# Patient Record
Sex: Female | Born: 1960 | Race: White | Hispanic: No | Smoking: Current every day smoker
Health system: Southern US, Community
[De-identification: ages and names within clinical notes are randomized; demographics above are authoritative.]

## PROBLEM LIST (undated history)

## (undated) DIAGNOSIS — Z720 Tobacco use: Secondary | ICD-10-CM

## (undated) DIAGNOSIS — J45909 Unspecified asthma, uncomplicated: Secondary | ICD-10-CM

## (undated) DIAGNOSIS — K219 Gastro-esophageal reflux disease without esophagitis: Secondary | ICD-10-CM

## (undated) DIAGNOSIS — I5189 Other ill-defined heart diseases: Secondary | ICD-10-CM

## (undated) DIAGNOSIS — I3139 Other pericardial effusion (noninflammatory): Secondary | ICD-10-CM

## (undated) DIAGNOSIS — D472 Monoclonal gammopathy: Secondary | ICD-10-CM

## (undated) DIAGNOSIS — G621 Alcoholic polyneuropathy: Secondary | ICD-10-CM

## (undated) DIAGNOSIS — Z7289 Other problems related to lifestyle: Secondary | ICD-10-CM

## (undated) DIAGNOSIS — F1090 Alcohol use, unspecified, uncomplicated: Secondary | ICD-10-CM

## (undated) DIAGNOSIS — Z789 Other specified health status: Secondary | ICD-10-CM

## (undated) HISTORY — DX: Other ill-defined heart diseases: I51.89

## (undated) HISTORY — DX: Other pericardial effusion (noninflammatory): I31.39

## (undated) HISTORY — PX: NO PAST SURGERIES: SHX2092

---

## 2012-09-07 ENCOUNTER — Emergency Department: Payer: Self-pay | Admitting: Emergency Medicine

## 2012-09-07 LAB — BASIC METABOLIC PANEL
BUN: 10 mg/dL (ref 7–18)
Calcium, Total: 8.9 mg/dL (ref 8.5–10.1)
Co2: 24 mmol/L (ref 21–32)
EGFR (Non-African Amer.): >60
Glucose: 104 mg/dL — ABNORMAL HIGH (ref 65–99)
Osmolality: 273 (ref 275–301)

## 2012-09-07 LAB — CBC
MCV: 91 fL (ref 80–100)
Platelet: 201 10*3/uL (ref 150–440)
RBC: 4.71 10*6/uL (ref 3.80–5.20)
WBC: 8 10*3/uL (ref 3.6–11.0)

## 2012-09-09 ENCOUNTER — Telehealth: Payer: Self-pay

## 2012-09-09 NOTE — Telephone Encounter (Signed)
Pt needs to schedule GXT per Dr Kirke Corin 4-5 days and follow up after. Unable to leave msg.

## 2012-09-10 NOTE — Telephone Encounter (Signed)
NA x2

## 2017-02-20 ENCOUNTER — Other Ambulatory Visit: Payer: Self-pay

## 2017-02-20 ENCOUNTER — Emergency Department
Admission: EM | Admit: 2017-02-20 | Discharge: 2017-02-20 | Disposition: A | Discharge status: Home / Self Care | Payer: 59 | Attending: Emergency Medicine | Admitting: Emergency Medicine

## 2017-02-20 ENCOUNTER — Emergency Department: Payer: 59

## 2017-02-20 DIAGNOSIS — L03116 Cellulitis of left lower limb: Secondary | ICD-10-CM

## 2017-02-20 DIAGNOSIS — M79662 Pain in left lower leg: Secondary | ICD-10-CM | POA: Diagnosis present

## 2017-02-20 LAB — COMPREHENSIVE METABOLIC PANEL
ALK PHOS: 200 U/L — AB (ref 38–126)
ALT: 41 U/L (ref 14–54)
ANION GAP: 11 (ref 5–15)
AST: 45 U/L — ABNORMAL HIGH (ref 15–41)
Albumin: 4.3 g/dL (ref 3.5–5.0)
BUN: 14 mg/dL (ref 6–20)
CALCIUM: 9 mg/dL (ref 8.9–10.3)
CHLORIDE: 104 mmol/L (ref 101–111)
CO2: 23 mmol/L (ref 22–32)
Creatinine, Ser: 0.6 mg/dL (ref 0.44–1.00)
GFR calc non Af Amer: >60 mL/min (ref >60)
Glucose, Bld: 174 mg/dL — ABNORMAL HIGH (ref 65–99)
POTASSIUM: 3.9 mmol/L (ref 3.5–5.1)
SODIUM: 138 mmol/L (ref 135–145)
Total Bilirubin: 0.5 mg/dL (ref 0.3–1.2)
Total Protein: 8 g/dL (ref 6.5–8.1)

## 2017-02-20 LAB — CBC
HCT: 40.5 % (ref 35.0–47.0)
Hemoglobin: 13.7 g/dL (ref 12.0–16.0)
MCH: 30.1 pg (ref 26.0–34.0)
MCHC: 33.7 g/dL (ref 32.0–36.0)
MCV: 89.3 fL (ref 80.0–100.0)
Platelets: 204 10*3/uL (ref 150–440)
RBC: 4.54 MIL/uL (ref 3.80–5.20)
RDW: 13.1 % (ref 11.5–14.5)
WBC: 5.7 10*3/uL (ref 3.6–11.0)

## 2017-02-20 MED ORDER — CEPHALEXIN 500 MG PO CAPS
500.0000 mg | ORAL_CAPSULE | Freq: Once | ORAL | Status: AC
  Administered 2017-02-20: 500 mg via ORAL

## 2017-02-20 MED ORDER — CEPHALEXIN 500 MG PO CAPS: 500.0000 mg | ORAL_CAPSULE | Freq: Three times a day (TID) | ORAL | 0 refills | Status: DC

## 2017-02-20 MED ORDER — CEPHALEXIN 500 MG PO CAPS
ORAL_CAPSULE | ORAL | Status: AC
  Administered 2017-02-20: 500 mg via ORAL
  Filled 2017-02-20: qty 1

## 2017-02-20 NOTE — ED Provider Notes (Signed)
[facility] Emergency Department Provider Note  Time seen: 10:15 PM  I have reviewed the triage vital signs and the nursing notes.   HISTORY  Chief Complaint Leg Pain    HPI Peggy Pacheco is a 57 y.o. female with no significant past medical history presents to the emergency department for left lower extremity swelling and redness.  According to the patient for the past 3-4 weeks she has been experiencing redness and swelling with mild tenderness to the left lower extremity.  States the redness comes and goes the swelling comes and goes but has been worse over the past several days.  She cannot get in with her primary care doctor into the end of March so she came to the emergency department for evaluation.  Denies any chest pain or trouble breathing.  Denies any fever nausea or vomiting.  Largely negative review of systems otherwise.  No past medical history on file.  There are no active problems to display for this patient.   Prior to Admission medications   Not on File    No Known Allergies  No family history on file.  Social History Social History   Tobacco Use  . Smoking status: Not on file  Substance Use Topics  . Alcohol use: Not on file  . Drug use: Not on file    Review of Systems Constitutional: Negative for fever. Eyes: Negative for visual complaints ENT: Negative for recent illness/congestion Cardiovascular: Negative for chest pain. Respiratory: Negative for shortness of breath. Gastrointestinal: Negative for abdominal pain, vomiting  Genitourinary: Negative for urinary compaints Musculoskeletal: Left leg swelling, redness and tenderness times 1 month. Skin: Redness of the left lower extremity. Neurological: Negative for headache All other ROS negative  ____________________________________________   PHYSICAL EXAM:  VITAL SIGNS: ED Triage Vitals [02/20/17 2027]  Enc Vitals Group     BP (!) 157/101     Pulse Rate (!) 109     Resp 20     Temp 98.7 F (37.1 C)     Temp Source Oral     SpO2 97 %     Weight 148 lb (67.1 kg)     Height 5\' 3"  (1.6 m)     Head Circumference      Peak Flow      Pain Score 4     Pain Loc      Pain Edu?      Excl. in [place]?    Constitutional: Alert and oriented. Well appearing and in no distress. Eyes: Normal exam ENT   Head: Normocephalic and atraumatic.   Mouth/Throat: Mucous membranes are moist. Cardiovascular: Normal rate, regular rhythm. No murmur Respiratory: Normal respiratory effort without tachypnea nor retractions. Breath sounds are clear Gastrointestinal: Soft and nontender. No distention.  Musculoskeletal: Left leg tenderness, erythema with 1+ edema.  Normal-appearing right extremity.  2+ DP pulses to bilateral lower extremities.  Neurovascular intact. Neurologic:  Normal speech and language. No gross focal neurologic deficits Skin:  Skin is warm, dry.  Erythema to the left lower leg from mid tibia and distal into the foot. Psychiatric: Mood and affect are normal. Speech and behavior are normal.   ____________________________________________   RADIOLOGY  Ultrasound negative for DVT  ____________________________________________   INITIAL IMPRESSION / ASSESSMENT AND PLAN / ED COURSE  Pertinent labs & imaging results that were available during my care of the patient were reviewed by me and considered in my medical decision making (see chart for details).  Patient presents to  the emergency department for left lower extremity redness tenderness and swelling ongoing for the past 1 month.  Differential would include cellulitis versus DVT.  Patient's labs are largely within normal limits.  Ultrasound negative for DVT.  Neurovascular intact with 2+ DP pulse.  Highly suspect cellulitis.  We will dose Keflex in the emergency department and discharged with the same 3 times daily for the next 10 days.  I also will discharge with compression stocking prescription and  the patient is to follow-up with her doctor.  Patient agreeable to this plan of care.  ____________________________________________   FINAL CLINICAL IMPRESSION(S) / ED DIAGNOSES  Cellulitis    Harvest Dark, MD 02/20/17 2220

## 2017-02-20 NOTE — ED Notes (Signed)
Pt able to ambulate back to treatment room without difficulty. Pt has noted swelling and redness to left ankle and calf. Leg is warm to the touch. No breaks in skin noted and not infection or irritation around the toe nails. Left sided pedal pulse is intact equal to right pedal pulse. Pt in NAD.

## 2017-02-20 NOTE — ED Triage Notes (Signed)
Pt in with co left lower leg pain with swelling and redness x 2 months. Was sent here by occupational nurse to rule out DVT. Redness noted to lower leg, no hx of dvt or cellulitis.

## 2019-07-30 ENCOUNTER — Encounter: Payer: Self-pay | Admitting: Emergency Medicine

## 2019-07-30 ENCOUNTER — Other Ambulatory Visit: Payer: Self-pay

## 2019-07-30 ENCOUNTER — Ambulatory Visit
Admission: EM | Admit: 2019-07-30 | Discharge: 2019-07-30 | Disposition: A | Discharge status: Home / Self Care | Payer: BC Managed Care – PPO | Attending: Emergency Medicine | Admitting: Emergency Medicine

## 2019-07-30 DIAGNOSIS — Z20822 Contact with and (suspected) exposure to covid-19: Secondary | ICD-10-CM | POA: Diagnosis present

## 2019-07-30 DIAGNOSIS — M542 Cervicalgia: Secondary | ICD-10-CM

## 2019-07-30 MED ORDER — NAPROXEN 500 MG PO TABS: 500.0000 mg | ORAL_TABLET | Freq: Two times a day (BID) | ORAL | 0 refills | Status: DC

## 2019-07-30 MED ORDER — TIZANIDINE HCL 4 MG PO TABS: 4.0000 mg | ORAL_TABLET | Freq: Three times a day (TID) | ORAL | 0 refills | Status: DC | PRN

## 2019-07-30 NOTE — ED Provider Notes (Signed)
HPI  SUBJECTIVE:  Peggy Pacheco is a 59 y.o. female who presents with 2 issues.  First, she is requesting Covid testing.  She states that she has had a close exposure to Covid positive patient 3 days ago.  She reports a right-sided posterior headache at the insertion of the trapezius.  She denies fevers, body aches, nasal congestion, sore throat, loss of sense of smell or taste, cough, shortness of breath, nausea, vomiting, diarrhea, abdominal pain.  She got the J&J vaccine.  Second, she reports 1 week of a daily right posterior neck pain described as a "crick".  She is not sure if she slept on it wrong.  She denies trauma to her neck.  It is intermittent, stabbing, lasting seconds, seems to be brought on by swallowing.  No fevers, neck stiffness, numbness or tingling in arm, swelling in the area of pain, difficulty breathing, drooling, voice changes, rash, sensation of her throat swelling shut.  No paresthesias in the area of pain.  She tried Naprosyn with improvement in her symptoms.  Her neck pain is worse with swallowing, flexion extension, lateral bending to the left.  Has had similar pain before in this area, but is always resolved on its own.  Past medical history negative for diabetes, hypertension, coronary disease, chronic kidney disease, HIV, cancer, immunocompromise, pulmonary disease.  She is a smoker.  She does not recall getting chickenpox.  She has not had shingles.  CZY:SAYTKZ, [facility]   History reviewed. No pertinent past medical history.  Past Surgical History:  Procedure Laterality Date  . NO PAST SURGERIES      History reviewed. No pertinent family history.  Social History   Tobacco Use  . Smoking status: Current Every Day Smoker    Packs/day: 0.50  . Smokeless tobacco: Never Used  Substance Use Topics  . Alcohol use: Yes  . Drug use: Not Currently    No current facility-administered medications for this encounter.  Current Outpatient Medications:  .   omeprazole (PRILOSEC) 20 MG capsule, Take 20 mg by mouth daily., Disp: , Rfl:  .  naproxen (NAPROSYN) 500 MG tablet, Take 1 tablet (500 mg total) by mouth 2 (two) times daily., Disp: 20 tablet, Rfl: 0 .  tiZANidine (ZANAFLEX) 4 MG tablet, Take 1 tablet (4 mg total) by mouth every 8 (eight) hours as needed for muscle spasms., Disp: 30 tablet, Rfl: 0  No Known Allergies   ROS  As noted in HPI.   Physical Exam  BP (!) 163/93 (BP Location: Right Arm)   Pulse 82   Temp 98 F (36.7 C) (Oral)   Resp 18   Ht 5\' 3"  (1.6 m)   Wt 67.1 kg   SpO2 97%   BMI 26.20 kg/m   Constitutional: Well developed, well nourished, no acute distress Eyes:  EOMI, conjunctiva normal bilaterally HENT: Normocephalic, atraumatic,mucus membranes moist.  Right external ear, external ear canal TM normal.  No pain with palpation of mastoid.  No swelling behind the ear.  Tender right upper molar with decay.  Patient states this is not new.  No gingival swelling, expressible purulent drainage.  No trismus.  Normal oropharynx, uvula midline.  Normal rise of palate. Neck: Positive tenderness at the insertion of the right trapezius muscle.  Positive tenderness along the right trapezius muscle.  No tenderness along the sternocleidomastoid.  Pain aggravated with flexion extension and lateral bending to the left.  No meningismus.  No cervical lymphadenopathy.  Respiratory: Normal inspiratory effort Cardiovascular:  Normal rate GI: nondistended skin: No rash in area of pain, skin intact Musculoskeletal: Strength upper extremity equal bilaterally, sensation grossly intact over the entire right upper extremity.  Grip strength 5/5 and equal bilaterally. Neurologic: Alert & oriented x 3, no focal neuro deficits Psychiatric: Speech and behavior appropriate   ED Course   Medications - No data to display  Orders Placed This Encounter  Procedures  . SARS CORONAVIRUS 2 (TAT 6-24 HRS) Nasopharyngeal Nasopharyngeal Swab     Standing Status:   Standing    Number of Occurrences:   1    Order Specific Question:   Is this test for diagnosis or screening    Answer:   Screening    Order Specific Question:   Symptomatic for COVID-19 as defined by CDC    Answer:   No    Order Specific Question:   Hospitalized for COVID-19    Answer:   No    Order Specific Question:   Admitted to ICU for COVID-19    Answer:   No    Order Specific Question:   Previously tested for COVID-19    Answer:   No    Order Specific Question:   Resident in a congregate (group) care setting    Answer:   No    Order Specific Question:   Employed in healthcare setting    Answer:   No    Order Specific Question:   Pregnant    Answer:   No    Order Specific Question:   Has patient completed COVID vaccination(s) (2 doses of Pfizer/Moderna 1 dose of [employer])    Answer:   Yes    No results found for this or any previous visit (from the past 24 hour(s)). No results found.  ED Clinical Impression  1. Neck pain on right side   2. Close exposure to COVID-19 virus   3. Encounter for laboratory testing for COVID-19 virus      ED Assessment/Plan  1.  Covid testing.  Covid PCR sent.  2.  Right neck pain.  Suspect musculoskeletal cause as it is along the insertion of the trapezius and the trapezius itself.  It is aggravated with activation of the trapezius.  There is no rash suggestive of shingles, no induration, fluctuance, erythema suggestive of an abscess.  I cannot explain why this area hurts when she swallows.  I am unsure if this is related to the decayed tooth that she has had for some time.  Sending home with Aleve//Tylenol, Zanaflex and will have her follow-up with Dr. Richardson Landry, ENT if no better in 5 to 7 days.  Discussed labs, MDM, treatment plan, and plan for follow-up with patient. Discussed sn/sx that should prompt return to the ED. patient agrees with plan.   Meds ordered this encounter  Medications  . naproxen (NAPROSYN)  500 MG tablet    Sig: Take 1 tablet (500 mg total) by mouth 2 (two) times daily.    Dispense:  20 tablet    Refill:  0  . tiZANidine (ZANAFLEX) 4 MG tablet    Sig: Take 1 tablet (4 mg total) by mouth every 8 (eight) hours as needed for muscle spasms.    Dispense:  30 tablet    Refill:  0    *This clinic note was created using [employer]. Therefore, there may be occasional mistakes despite careful proofreading.   ?    Melynda Ripple, MD 07/30/19 2042

## 2019-07-30 NOTE — Discharge Instructions (Addendum)
We will contact you if your Covid test comes back positive.  I am unsure as to the exact cause of your neck pain, but it seems to be musculoskeletal here today rather than infectious.  I am going to start you on Naprosyn 500 mg twice a day.  You may take 1000 mg of Tylenol with this.  Zanaflex will help with muscle spasms.  Warm or cool compresses, whichever feels better.  Follow-up with ENT if you are not feeling better in 5 to 7 days.  Go immediately to the ER for fevers above 100.4, an inability to move your neck, sensation of throat swelling shut, difficulty breathing or swallowing your saliva because your throat is swelling shut, or for other concerns

## 2019-07-30 NOTE — ED Triage Notes (Signed)
Pt was exposed to covid about 3 days ago. She has pain or creak in her neck and she states the pain is reproduced when she swallows. Started about 3 days ago but worse today.

## 2019-07-31 LAB — SARS CORONAVIRUS 2 (TAT 6-24 HRS): SARS Coronavirus 2: NEGATIVE

## 2019-08-01 ENCOUNTER — Telehealth: Payer: Self-pay | Admitting: Emergency Medicine

## 2019-08-01 NOTE — Telephone Encounter (Signed)
Created in error

## 2019-09-10 ENCOUNTER — Other Ambulatory Visit: Payer: Self-pay

## 2019-09-10 ENCOUNTER — Ambulatory Visit
Admission: EM | Admit: 2019-09-10 | Discharge: 2019-09-10 | Disposition: A | Discharge status: Home / Self Care | Payer: BC Managed Care – PPO | Attending: Family Medicine | Admitting: Family Medicine

## 2019-09-10 ENCOUNTER — Encounter: Payer: Self-pay | Admitting: Emergency Medicine

## 2019-09-10 DIAGNOSIS — B349 Viral infection, unspecified: Secondary | ICD-10-CM

## 2019-09-10 DIAGNOSIS — R059 Cough, unspecified: Secondary | ICD-10-CM

## 2019-09-10 DIAGNOSIS — R05 Cough: Secondary | ICD-10-CM

## 2019-09-10 DIAGNOSIS — Z20822 Contact with and (suspected) exposure to covid-19: Secondary | ICD-10-CM | POA: Diagnosis not present

## 2019-09-10 DIAGNOSIS — R5383 Other fatigue: Secondary | ICD-10-CM

## 2019-09-10 HISTORY — DX: Gastro-esophageal reflux disease without esophagitis: K21.9

## 2019-09-10 LAB — SARS CORONAVIRUS 2 (TAT 6-24 HRS): SARS Coronavirus 2: NEGATIVE

## 2019-09-10 NOTE — ED Triage Notes (Signed)
Patient in today c/o cough, sinus congestion, headache and body aches x 4-5 days. Patient had fever (100.8) yesterday. Patient has taken OTC Robitussin to help her symptoms.

## 2019-09-10 NOTE — Discharge Instructions (Addendum)

## 2019-09-10 NOTE — ED Provider Notes (Signed)
[facility]    CSN: [id number] Arrival date & time: 09/10/19  1660      History   Chief Complaint Chief Complaint  Patient presents with   Cough   sinus congestion   Headache   Generalized Body Aches    HPI Peggy Pacheco is a 59 y.o. female.   59 year old female presents for 4 to 5-day history of fatigue, body aches, cough, sinus congestion and headaches.  Patient denies known Covid exposure but would like to be Covid tested today.  She says she is taken over-the-counter Robitussin which is helped.  Patient denies any known fevers, weakness.  Denies any chest pain or shortness of breath.  She is is a smoker, but denies any COPD or known lung disease.  Patient said she has similar symptoms in July of this year and was treated with steroids and an antibiotic and got better in a couple of days.  Patient has no other concerns today.     Past Medical History:  Diagnosis Date   GERD (gastroesophageal reflux disease)     There are no problems to display for this patient.   Past Surgical History:  Procedure Laterality Date   NO PAST SURGERIES      OB History   No obstetric history on file.      Home Medications    Prior to Admission medications   Medication Sig Start Date End Date Taking? Authorizing Provider  naproxen (NAPROSYN) 500 MG tablet Take 1 tablet (500 mg total) by mouth 2 (two) times daily. 07/30/19  Yes Melynda Ripple, MD  omeprazole (PRILOSEC) 20 MG capsule Take 20 mg by mouth daily.   Yes [provider]  tiZANidine (ZANAFLEX) 4 MG tablet Take 1 tablet (4 mg total) by mouth every 8 (eight) hours as needed for muscle spasms. 07/30/19   Melynda Ripple, MD    Family History Family History  Adopted: Yes  Family history unknown: Yes    Social History Social History   Tobacco Use   Smoking status: Current Every Day Smoker    Packs/day: 0.50    Years: 45.00    Pack years: 22.50    Types: Cigarettes   Smokeless  tobacco: Never Used  Scientific laboratory technician Use: Never used  Substance Use Topics   Alcohol use: Yes    Alcohol/week: 42.0 standard drinks    Types: 42 Cans of beer per week    Comment: 6 beers per night   Drug use: Not Currently     Allergies   Patient has no known allergies.   Review of Systems Review of Systems  Constitutional: Positive for fatigue. Negative for chills, diaphoresis and fever.  HENT: Positive for congestion, rhinorrhea and sinus pressure. Negative for ear pain, sinus pain and sore throat.   Respiratory: Positive for cough. Negative for shortness of breath.   Gastrointestinal: Negative for abdominal pain, nausea and vomiting.  Musculoskeletal: Positive for myalgias. Negative for arthralgias.  Skin: Negative for rash.  Neurological: Positive for headaches. Negative for weakness.  Hematological: Negative for adenopathy.     Physical Exam Triage Vital Signs ED Triage Vitals  Enc Vitals Group     BP 09/10/19 1002 (!) 135/92     Pulse Rate 09/10/19 1002 91     Resp 09/10/19 1002 18     Temp 09/10/19 1002 98.6 F (37 C)     Temp Source 09/10/19 1002 Oral     SpO2 09/10/19 1002 97 %  Weight 09/10/19 1000 150 lb (68 kg)     Height 09/10/19 1000 5\' 3"  (1.6 m)     Head Circumference --      Peak Flow --      Pain Score 09/10/19 1000 1     Pain Loc --      Pain Edu? --      Excl. in [place]? --    No data found.  Updated Vital Signs BP (!) 135/92 (BP Location: Left Arm)    Pulse 91    Temp 98.6 F (37 C) (Oral)    Resp 18    Ht 5\' 3"  (1.6 m)    Wt 150 lb (68 kg)    SpO2 97%    BMI 26.57 kg/m      Physical Exam Vitals and nursing note reviewed.  Constitutional:      General: She is not in acute distress.    Appearance: Normal appearance. She is not ill-appearing or toxic-appearing.  HENT:     Head: Normocephalic and atraumatic.     Right Ear: Tympanic membrane, ear canal and external ear normal.     Left Ear: Tympanic membrane, ear canal and  external ear normal.     Nose: Congestion and rhinorrhea present.     Mouth/Throat:     Mouth: Mucous membranes are moist.     Pharynx: Oropharynx is clear. Posterior oropharyngeal erythema present.  Eyes:     General: No scleral icterus.       Right eye: No discharge.        Left eye: No discharge.     Conjunctiva/sclera: Conjunctivae normal.  Cardiovascular:     Rate and Rhythm: Normal rate and regular rhythm.     Heart sounds: Normal heart sounds.  Pulmonary:     Effort: Pulmonary effort is normal. No respiratory distress.     Breath sounds: Normal breath sounds. No wheezing, rhonchi or rales.  Musculoskeletal:     Cervical back: Neck supple.  Skin:    General: Skin is dry.  Neurological:     General: No focal deficit present.     Mental Status: She is alert. Mental status is at baseline.     Motor: No weakness.     Gait: Gait normal.  Psychiatric:        Mood and Affect: Mood normal.        Behavior: Behavior normal.        Thought Content: Thought content normal.      UC Treatments / Results  Labs (all labs ordered are listed, but only abnormal results are displayed) Labs Reviewed  SARS CORONAVIRUS 2 (TAT 6-24 HRS)    EKG   Radiology No results found.  Procedures Procedures (including critical care time)  Medications Ordered in UC Medications - No data to display  Initial Impression / Assessment and Plan / UC Course  I have reviewed the triage vital signs and the nursing notes.  Pertinent labs & imaging results that were available during my care of the patient were reviewed by me and considered in my medical decision making (see chart for details).   Based on patient's symptoms there is moderate concern for Covid infection.  Her vital signs are all stable.  She also does not appear to be acutely ill.  On exam her lungs are clear.  Covid test pending at this time.  Advised her to isolate and follow CDC guidelines for Covid at this time.  No indication for  imaging  at this point.  Advised patient her symptoms are consistent with a viral infection.  She declined cough medication.  Advised her that antibiotics are not needed at this point.  However, if she develops any higher fevers, shortness of breath, chest pain or any new or worsening symptoms she should be seen again and it can be considered.  ED precautions discussed with patient.   Patient has been vaccinated.  She says she would be interested in MAB therapy if she does test positive for Covid.  Final Clinical Impressions(s) / UC Diagnoses   Final diagnoses:  Viral illness  Cough  Fatigue, unspecified type     Discharge Instructions     URI/COLD SYMPTOMS: Your exam today is consistent with a viral illness. Antibiotics are not indicated at this time. Use medications as directed, including cough syrup, nasal saline, and decongestants. Your symptoms should improve over the next few days and resolve within 7-10 days. Increase rest and fluids. F/u if symptoms worsen or predominate such as sore throat, ear pain, productive cough, shortness of breath, or if you develop high fevers or worsening fatigue over the next several days.    You have received COVID testing today either for positive exposure, concerning symptoms that could be related to COVID infection, screening purposes, or re-testing after confirmed positive.  Your test obtained today checks for active viral infection in the last 1-2 weeks. If your test is negative now, you can still test positive later. So, if you do develop symptoms you should either get re-tested and/or isolate x 10 days. Please follow CDC guidelines.  While Rapid antigen tests come back in 15-20 minutes, send out PCR/molecular test results typically come back within 24 hours. In the mean time, if you are symptomatic, assume this could be a positive test and treat/monitor yourself as if you do have COVID.   We will call with test results. Please download the MyChart app  and set up a profile to access test results.   If symptomatic, go home and rest. Push fluids. Take Tylenol as needed for discomfort. Gargle warm salt water. Throat lozenges. Take Mucinex DM or Robitussin for cough. Humidifier in bedroom to ease coughing. Warm showers. Also review the COVID handout for more information.  COVID-19 INFECTION: The incubation period of COVID-19 is approximately 14 days after exposure, with most symptoms developing in roughly 4-5 days. Symptoms may range in severity from mild to critically severe. Roughly 80% of those infected will have mild symptoms. People of any age may become infected with COVID-19 and have the ability to transmit the virus. The most common symptoms include: fever, fatigue, cough, body aches, headaches, sore throat, nasal congestion, shortness of breath, nausea, vomiting, diarrhea, changes in smell and/or taste.    COURSE OF ILLNESS Some patients may begin with mild disease which can progress quickly into critical symptoms. If your symptoms are worsening please call ahead to the Emergency Department and proceed there for further treatment. Recovery time appears to be roughly 1-2 weeks for mild symptoms and 3-6 weeks for severe disease.   GO IMMEDIATELY TO ER FOR FEVER YOU ARE UNABLE TO GET DOWN WITH TYLENOL, BREATHING PROBLEMS, CHEST PAIN, FATIGUE, LETHARGY, INABILITY TO EAT OR DRINK, ETC  QUARANTINE AND ISOLATION: To help decrease the spread of COVID-19 please remain isolated if you have COVID infection or are highly suspected to have COVID infection. This means -stay home and isolate to one room in the home if you live with others. Do not share a  bed or bathroom with others while ill, sanitize and wipe down all countertops and keep common areas clean and disinfected. You may discontinue isolation if you have a mild case and are asymptomatic 10 days after symptom onset as long as you have been fever free >24 hours without having to take Motrin or Tylenol.  If your case is more severe (meaning you develop pneumonia or are admitted in the hospital), you may have to isolate longer.   If you have been in close contact (within 6 feet) of someone diagnosed with COVID 19, you are advised to quarantine in your home for 14 days as symptoms can develop anywhere from 2-14 days after exposure to the virus. If you develop symptoms, you  must isolate.  Most current guidelines for COVID after exposure -isolate 10 days if you ARE NOT tested for COVID as long as symptoms do not develop -isolate 7 days if you are tested and remain asymptomatic -You do not necessarily need to be tested for COVID if you have + exposure and        develop   symptoms. Just isolate at home x10 days from symptom onset During this global pandemic, CDC advises to practice social distancing, try to stay at least 47ft away from others at all times. Wear a face covering. Wash and sanitize your hands regularly and avoid going anywhere that is not necessary.  KEEP IN MIND THAT THE COVID TEST IS NOT 100% ACCURATE AND YOU SHOULD STILL DO EVERYTHING TO PREVENT POTENTIAL SPREAD OF VIRUS TO OTHERS (WEAR MASK, WEAR GLOVES, [place] HANDS AND SANITIZE REGULARLY). IF INITIAL TEST IS NEGATIVE, THIS MAY NOT MEAN YOU ARE DEFINITELY NEGATIVE. MOST ACCURATE TESTING IS DONE 5-7 DAYS AFTER EXPOSURE.   It is not advised by CDC to get re-tested after receiving a positive COVID test since you can still test positive for weeks to months after you have already cleared the virus.   *If you have not been vaccinated for COVID, I strongly suggest you consider getting vaccinated as long as there are no contraindications.      ED Prescriptions    None     PDMP not reviewed this encounter.   Danton Clap, PA-C 09/10/19 1039

## 2019-09-16 ENCOUNTER — Other Ambulatory Visit: Payer: Self-pay

## 2019-09-16 ENCOUNTER — Encounter: Payer: Self-pay | Admitting: Emergency Medicine

## 2019-09-16 ENCOUNTER — Ambulatory Visit
Admission: EM | Admit: 2019-09-16 | Discharge: 2019-09-16 | Disposition: A | Discharge status: Home / Self Care | Payer: BC Managed Care – PPO | Attending: Emergency Medicine | Admitting: Emergency Medicine

## 2019-09-16 DIAGNOSIS — J22 Unspecified acute lower respiratory infection: Secondary | ICD-10-CM

## 2019-09-16 DIAGNOSIS — J019 Acute sinusitis, unspecified: Secondary | ICD-10-CM

## 2019-09-16 MED ORDER — DOXYCYCLINE HYCLATE 100 MG PO CAPS: 100.0000 mg | ORAL_CAPSULE | Freq: Two times a day (BID) | ORAL | 0 refills | Status: AC

## 2019-09-16 MED ORDER — AEROCHAMBER PLUS MISC: 2 refills | Status: DC

## 2019-09-16 MED ORDER — HYDROCOD POLST-CPM POLST ER 10-8 MG/5ML PO SUER: 5.0000 mL | Freq: Two times a day (BID) | ORAL | 0 refills | Status: DC | PRN

## 2019-09-16 MED ORDER — BENZONATATE 200 MG PO CAPS: 200.0000 mg | ORAL_CAPSULE | Freq: Three times a day (TID) | ORAL | 0 refills | Status: DC | PRN

## 2019-09-16 MED ORDER — FLUTICASONE PROPIONATE 50 MCG/ACT NA SUSP: 2.0000 | Freq: Every day | NASAL | 0 refills | Status: DC

## 2019-09-16 NOTE — ED Triage Notes (Signed)
Pt c/o cough, sinus congestion, headache. Started about 2 weeks ago. Pt was negative for covid.

## 2019-09-16 NOTE — ED Provider Notes (Signed)
HPI  SUBJECTIVE:  Peggy Pacheco is a 59 y.o. female who presents with URI symptoms for the past 2 weeks.  She was seen here and had a negative Covid PCR test after 1 day of being symptomatic.  She reports mild headache, nasal congestion, clear/yellow rhinorrhea, postnasal drip, cough productive of white phlegm, wheezing in the morning.  She states that she never got better.  No fevers, body aches, sore throat, sinus pain or pressure, facial swelling, upper dental pain, chest pain, shortness of breath.  States that she is unable to sleep at night secondary to the cough.  She states that both of her sisters have identical symptoms.  She states that she never got better.  No known Covid exposure.  She got the J&J vaccine.  No GERD symptoms.  She tried Robitussin which made her worse, DayQuil and Phenergan cough syrup.  DayQuil helps.  No antibiotics in the past month.  Antipyretic in the past 6 hours.  She is a smoker.  No history of diabetes, hypertension, frequent sinusitis, allergies, pulmonary disease.  PMD: [facility].    Past Medical History:  Diagnosis Date  . GERD (gastroesophageal reflux disease)     Past Surgical History:  Procedure Laterality Date  . NO PAST SURGERIES      Family History  Adopted: Yes  Family history unknown: Yes    Social History   Tobacco Use  . Smoking status: Current Every Day Smoker    Packs/day: 0.50    Years: 45.00    Pack years: 22.50    Types: Cigarettes  . Smokeless tobacco: Never Used  Vaping Use  . Vaping Use: Never used  Substance Use Topics  . Alcohol use: Yes    Alcohol/week: 42.0 standard drinks    Types: 42 Cans of beer per week    Comment: 6 beers per night  . Drug use: Not Currently    No current facility-administered medications for this encounter.  Current Outpatient Medications:  .  naproxen (NAPROSYN) 500 MG tablet, Take 1 tablet (500 mg total) by mouth 2 (two) times daily., Disp: 20 tablet, Rfl: 0 .  omeprazole  (PRILOSEC) 20 MG capsule, Take 20 mg by mouth daily., Disp: , Rfl:  .  tiZANidine (ZANAFLEX) 4 MG tablet, Take 1 tablet (4 mg total) by mouth every 8 (eight) hours as needed for muscle spasms., Disp: 30 tablet, Rfl: 0 .  benzonatate (TESSALON) 200 MG capsule, Take 1 capsule (200 mg total) by mouth 3 (three) times daily as needed for cough., Disp: 30 capsule, Rfl: 0 .  chlorpheniramine-HYDROcodone (TUSSIONEX PENNKINETIC ER) 10-8 MG/5ML SUER, Take 5 mLs by mouth every 12 (twelve) hours as needed for cough., Disp: 60 mL, Rfl: 0 .  doxycycline (VIBRAMYCIN) 100 MG capsule, Take 1 capsule (100 mg total) by mouth 2 (two) times daily for 7 days., Disp: 14 capsule, Rfl: 0 .  fluticasone (FLONASE) 50 MCG/ACT nasal spray, Place 2 sprays into both nostrils daily., Disp: 16 g, Rfl: 0 .  Spacer/Aero-Holding Chambers (AEROCHAMBER PLUS) inhaler, Use as instructed, Disp: 1 each, Rfl: 2  No Known Allergies   ROS  As noted in HPI.   Physical Exam  BP (!) 153/91 (BP Location: Right Arm)   Pulse 82   Temp 98.4 F (36.9 C) (Oral)   Resp 18   Ht 5\' 3"  (1.6 m)   Wt 68 kg   SpO2 97%   BMI 26.56 kg/m   Constitutional: Well developed, well nourished, no acute  distress Eyes:  EOMI, conjunctiva normal bilaterally HENT: Normocephalic, atraumatic,mucus membranes moist.  Positive nasal congestion.  No maxillary, frontal sinus tenderness.  Normal turbinates.  Positive cobblestoning and postnasal drip. Respiratory: Normal inspiratory effort, lungs clear bilaterally, good air movement.  No anterior or lateral chest wall tenderness Cardiovascular: Normal rate, regular rhythm no murmurs rubs or gallops GI: nondistended skin: No rash, skin intact Musculoskeletal: no deformities Neurologic: Alert & oriented x 3, no focal neuro deficits Psychiatric: Speech and behavior appropriate   ED Course   Medications - No data to display  No orders of the defined types were placed in this encounter.   No results found  for this or any previous visit (from the past 24 hour(s)). No results found.  ED Clinical Impression  1. LRTI (lower respiratory tract infection)   2. Acute non-recurrent sinusitis, unspecified location      ED Assessment/Plan Patient was seen here 9/8 for this, Covid was negative.  Patient with a sinusitis/lower respiratory tract infection.  Home with doxycycline, Flonase, Mucinex D, Tessalon, Tussionex, saline nasal irrigation.  She has an inhaler at home.  Advised her to start using it.  We will also prescribe a spacer.  [facility] narcotic database reviewed.  Feel it is appropriate to proceed with  controlled substance prescription.  No opiate prescriptions in the past 2 years.  Discussed  MDM, treatment plan, and plan for follow-up with patient. patient agrees with plan.   Meds ordered this encounter  Medications  . doxycycline (VIBRAMYCIN) 100 MG capsule    Sig: Take 1 capsule (100 mg total) by mouth 2 (two) times daily for 7 days.    Dispense:  14 capsule    Refill:  0  . fluticasone (FLONASE) 50 MCG/ACT nasal spray    Sig: Place 2 sprays into both nostrils daily.    Dispense:  16 g    Refill:  0  . benzonatate (TESSALON) 200 MG capsule    Sig: Take 1 capsule (200 mg total) by mouth 3 (three) times daily as needed for cough.    Dispense:  30 capsule    Refill:  0  . chlorpheniramine-HYDROcodone (TUSSIONEX PENNKINETIC ER) 10-8 MG/5ML SUER    Sig: Take 5 mLs by mouth every 12 (twelve) hours as needed for cough.    Dispense:  60 mL    Refill:  0  . Spacer/Aero-Holding Chambers (AEROCHAMBER PLUS) inhaler    Sig: Use as instructed    Dispense:  1 each    Refill:  2    *This clinic note was created using Dragon dictation software. Therefore, there may be occasional mistakes despite careful proofreading.   ?    Melynda Ripple, MD 09/18/19 [phone]

## 2019-09-16 NOTE — Discharge Instructions (Addendum)
Finish the doxycycline, even if you feel better.  Flonase, Mucinex D, Tessalon for the cough during the day, Tussionex for the cough at night, saline nasal irrigation with a Milta Deiters med rinse and distilled water as often as you want.  2 puffs from your albuterol inhaler every 4-6 hours using your spacer as needed for coughing, wheezing.

## 2020-05-03 ENCOUNTER — Encounter: Payer: Self-pay | Admitting: Emergency Medicine

## 2020-05-03 ENCOUNTER — Other Ambulatory Visit: Payer: Self-pay

## 2020-05-03 ENCOUNTER — Ambulatory Visit
Admission: EM | Admit: 2020-05-03 | Discharge: 2020-05-03 | Disposition: A | Discharge status: Home / Self Care | Payer: BC Managed Care – PPO | Attending: Family Medicine | Admitting: Family Medicine

## 2020-05-03 DIAGNOSIS — F17210 Nicotine dependence, cigarettes, uncomplicated: Secondary | ICD-10-CM | POA: Insufficient documentation

## 2020-05-03 DIAGNOSIS — R052 Subacute cough: Secondary | ICD-10-CM | POA: Diagnosis present

## 2020-05-03 DIAGNOSIS — J209 Acute bronchitis, unspecified: Secondary | ICD-10-CM | POA: Insufficient documentation

## 2020-05-03 DIAGNOSIS — Z20822 Contact with and (suspected) exposure to covid-19: Secondary | ICD-10-CM | POA: Diagnosis not present

## 2020-05-03 LAB — SARS CORONAVIRUS 2 (TAT 6-24 HRS): SARS Coronavirus 2: NEGATIVE

## 2020-05-03 MED ORDER — CETIRIZINE-PSEUDOEPHEDRINE ER 5-120 MG PO TB12: 1.0000 | ORAL_TABLET | Freq: Two times a day (BID) | ORAL | 0 refills | Status: DC

## 2020-05-03 MED ORDER — DOXYCYCLINE HYCLATE 100 MG PO CAPS: 100.0000 mg | ORAL_CAPSULE | Freq: Two times a day (BID) | ORAL | 0 refills | Status: DC

## 2020-05-03 MED ORDER — PROMETHAZINE-DM 6.25-15 MG/5ML PO SYRP: 5.0000 mL | ORAL_SOLUTION | Freq: Four times a day (QID) | ORAL | 0 refills | Status: DC | PRN

## 2020-05-03 NOTE — ED Triage Notes (Signed)
Pt presents today with c/o cough (productive), nasal congestion and weakness. Denies fever. x2 weeks.

## 2020-05-03 NOTE — ED Provider Notes (Signed)
[facility]    CSN: [id number] Arrival date & time: 05/03/20  0842      History   Chief Complaint Chief Complaint  Patient presents with  . Nasal Congestion  . Cough   HPI   60 year old female presents with respiratory symptoms.  2-week history of symptoms.  Reports productive cough, congestion, generalized weakness, and shortness of breath.  She is a smoker.  She reports some lower rib pain from coughing.  No fever.  No relieving factors.  No other associated symptoms.  No other complaints.  Past Medical History:  Diagnosis Date  . GERD (gastroesophageal reflux disease)    Past Surgical History:  Procedure Laterality Date  . NO PAST SURGERIES     OB History   No obstetric history on file.    Home Medications    Prior to Admission medications   Medication Sig Start Date End Date Taking? Authorizing Provider  cetirizine-pseudoephedrine (ZYRTEC-D) 5-120 MG tablet Take 1 tablet by mouth 2 (two) times daily. 05/03/20  Yes Katrinia Straker G, DO  doxycycline (VIBRAMYCIN) 100 MG capsule Take 1 capsule (100 mg total) by mouth 2 (two) times daily. 05/03/20  Yes Cynthia Cogle G, DO  naproxen (NAPROSYN) 500 MG tablet Take 1 tablet (500 mg total) by mouth 2 (two) times daily. 07/30/19  Yes Melynda Ripple, MD  omeprazole (PRILOSEC) 20 MG capsule Take 20 mg by mouth daily.   Yes [provider]  promethazine-dextromethorphan (PROMETHAZINE-DM) 6.25-15 MG/5ML syrup Take 5 mLs by mouth 4 (four) times daily as needed for cough. 05/03/20  Yes Elijahjames Fuelling G, DO  fluticasone (FLONASE) 50 MCG/ACT nasal spray Place 2 sprays into both nostrils daily. 09/16/19 05/03/20  Melynda Ripple, MD    Family History Family History  Adopted: Yes  Family history unknown: Yes    Social History Social History   Tobacco Use  . Smoking status: Current Every Day Smoker    Packs/day: 0.50    Years: 45.00    Pack years: 22.50    Types: Cigarettes  . Smokeless tobacco: Never Used  Vaping  Use  . Vaping Use: Never used  Substance Use Topics  . Alcohol use: Yes    Alcohol/week: 42.0 standard drinks    Types: 42 Cans of beer per week    Comment: 6 beers per night  . Drug use: Not Currently     Allergies   Patient has no known allergies.   Review of Systems Review of Systems  Constitutional: Negative for fever.  HENT: Positive for congestion.   Respiratory: Positive for cough and shortness of breath.    Physical Exam Triage Vital Signs ED Triage Vitals  Enc Vitals Group     BP 05/03/20 0856 (!) 165/107     Pulse Rate 05/03/20 0856 85     Resp 05/03/20 0856 18     Temp 05/03/20 0856 98.2 F (36.8 C)     Temp Source 05/03/20 0856 Oral     SpO2 05/03/20 0856 99 %     Weight 05/03/20 0853 148 lb (67.1 kg)     Height 05/03/20 0853 5\' 4"  (1.626 m)     Head Circumference --      Peak Flow --      Pain Score 05/03/20 0852 3     Pain Loc --      Pain Edu? --      Excl. in [place]? --    Updated Vital Signs BP (!) 165/107 (BP Location: Right Arm) Comment:  has never been diagnosed with HTN  Pulse 85   Temp 98.2 F (36.8 C) (Oral)   Resp 18   Ht 5\' 4"  (1.626 m)   Wt 67.1 kg   SpO2 99%   BMI 25.40 kg/m   Visual Acuity Right Eye Distance:   Left Eye Distance:   Bilateral Distance:    Right Eye Near:   Left Eye Near:    Bilateral Near:     Physical Exam Constitutional:      General: She is not in acute distress.    Appearance: Normal appearance. She is not ill-appearing.  HENT:     Head: Normocephalic and atraumatic.  Eyes:     General:        Right eye: No discharge.        Left eye: No discharge.     Conjunctiva/sclera: Conjunctivae normal.  Cardiovascular:     Rate and Rhythm: Normal rate and regular rhythm.  Pulmonary:     Effort: Pulmonary effort is normal.     Breath sounds: Normal breath sounds. No wheezing, rhonchi or rales.  Neurological:     Mental Status: She is alert.  Psychiatric:        Mood and Affect: Mood normal.         Behavior: Behavior normal.    UC Treatments / Results  Labs (all labs ordered are listed, but only abnormal results are displayed) Labs Reviewed  SARS CORONAVIRUS 2 (TAT 6-24 HRS)    EKG   Radiology No results found.  Procedures Procedures (including critical care time)  Medications Ordered in UC Medications - No data to display  Initial Impression / Assessment and Plan / UC Course  I have reviewed the triage vital signs and the nursing notes.  Pertinent labs & imaging results that were available during my care of the patient were reviewed by me and considered in my medical decision making (see chart for details).    60 year old female presents with acute bronchitis. Treating with Doxycycline, promethazine-DM, and Zyrtec-D.  Final Clinical Impressions(s) / UC Diagnoses   Final diagnoses:  Acute bronchitis, unspecified organism   Discharge Instructions   None    ED Prescriptions    Medication Sig Dispense Auth. Provider   doxycycline (VIBRAMYCIN) 100 MG capsule Take 1 capsule (100 mg total) by mouth 2 (two) times daily. 14 capsule Amarilis Belflower G, DO   promethazine-dextromethorphan (PROMETHAZINE-DM) 6.25-15 MG/5ML syrup Take 5 mLs by mouth 4 (four) times daily as needed for cough. 118 mL Dresden Ament G, DO   cetirizine-pseudoephedrine (ZYRTEC-D) 5-120 MG tablet Take 1 tablet by mouth 2 (two) times daily. 30 tablet Coral Spikes, DO     PDMP not reviewed this encounter.   Coral Spikes, DO 05/03/20 0930

## 2020-09-13 ENCOUNTER — Encounter: Payer: Self-pay | Admitting: Internal Medicine

## 2020-09-13 ENCOUNTER — Emergency Department: Payer: BC Managed Care – PPO

## 2020-09-13 ENCOUNTER — Inpatient Hospital Stay
Admission: EM | Admit: 2020-09-13 | Discharge: 2020-09-14 | DRG: 292 | Disposition: A | Discharge status: Home / Self Care | Payer: BC Managed Care – PPO | Attending: Internal Medicine | Admitting: Internal Medicine

## 2020-09-13 ENCOUNTER — Other Ambulatory Visit: Payer: Self-pay

## 2020-09-13 DIAGNOSIS — J90 Pleural effusion, not elsewhere classified: Secondary | ICD-10-CM | POA: Diagnosis not present

## 2020-09-13 DIAGNOSIS — F1099 Alcohol use, unspecified with unspecified alcohol-induced disorder: Secondary | ICD-10-CM

## 2020-09-13 DIAGNOSIS — I313 Pericardial effusion (noninflammatory): Secondary | ICD-10-CM | POA: Diagnosis present

## 2020-09-13 DIAGNOSIS — F1090 Alcohol use, unspecified, uncomplicated: Secondary | ICD-10-CM | POA: Insufficient documentation

## 2020-09-13 DIAGNOSIS — Z79899 Other long term (current) drug therapy: Secondary | ICD-10-CM | POA: Diagnosis not present

## 2020-09-13 DIAGNOSIS — K760 Fatty (change of) liver, not elsewhere classified: Secondary | ICD-10-CM | POA: Diagnosis present

## 2020-09-13 DIAGNOSIS — K219 Gastro-esophageal reflux disease without esophagitis: Secondary | ICD-10-CM | POA: Diagnosis present

## 2020-09-13 DIAGNOSIS — Z7141 Alcohol abuse counseling and surveillance of alcoholic: Secondary | ICD-10-CM | POA: Diagnosis not present

## 2020-09-13 DIAGNOSIS — Z7289 Other problems related to lifestyle: Secondary | ICD-10-CM | POA: Diagnosis not present

## 2020-09-13 DIAGNOSIS — F17210 Nicotine dependence, cigarettes, uncomplicated: Secondary | ICD-10-CM | POA: Diagnosis present

## 2020-09-13 DIAGNOSIS — Z789 Other specified health status: Secondary | ICD-10-CM | POA: Insufficient documentation

## 2020-09-13 DIAGNOSIS — R600 Localized edema: Secondary | ICD-10-CM | POA: Diagnosis not present

## 2020-09-13 DIAGNOSIS — Z720 Tobacco use: Secondary | ICD-10-CM | POA: Diagnosis not present

## 2020-09-13 DIAGNOSIS — Z20822 Contact with and (suspected) exposure to covid-19: Secondary | ICD-10-CM | POA: Diagnosis present

## 2020-09-13 DIAGNOSIS — R601 Generalized edema: Secondary | ICD-10-CM | POA: Diagnosis present

## 2020-09-13 DIAGNOSIS — R200 Anesthesia of skin: Secondary | ICD-10-CM | POA: Diagnosis present

## 2020-09-13 DIAGNOSIS — I110 Hypertensive heart disease with heart failure: Principal | ICD-10-CM | POA: Diagnosis present

## 2020-09-13 DIAGNOSIS — J918 Pleural effusion in other conditions classified elsewhere: Secondary | ICD-10-CM | POA: Diagnosis present

## 2020-09-13 DIAGNOSIS — F1010 Alcohol abuse, uncomplicated: Secondary | ICD-10-CM | POA: Diagnosis present

## 2020-09-13 DIAGNOSIS — I3139 Other pericardial effusion (noninflammatory): Secondary | ICD-10-CM

## 2020-09-13 DIAGNOSIS — R609 Edema, unspecified: Secondary | ICD-10-CM

## 2020-09-13 DIAGNOSIS — I509 Heart failure, unspecified: Secondary | ICD-10-CM | POA: Diagnosis present

## 2020-09-13 DIAGNOSIS — R0602 Shortness of breath: Secondary | ICD-10-CM | POA: Diagnosis present

## 2020-09-13 DIAGNOSIS — I10 Essential (primary) hypertension: Secondary | ICD-10-CM | POA: Diagnosis present

## 2020-09-13 DIAGNOSIS — Z716 Tobacco abuse counseling: Secondary | ICD-10-CM | POA: Diagnosis not present

## 2020-09-13 HISTORY — DX: Other specified health status: Z78.9

## 2020-09-13 HISTORY — DX: Tobacco use: Z72.0

## 2020-09-13 HISTORY — DX: Alcohol use, unspecified, uncomplicated: F10.90

## 2020-09-13 HISTORY — DX: Other problems related to lifestyle: Z72.89

## 2020-09-13 LAB — BASIC METABOLIC PANEL
Anion gap: 8 (ref 5–15)
BUN: 12 mg/dL (ref 6–20)
CO2: 28 mmol/L (ref 22–32)
Calcium: 8.7 mg/dL — ABNORMAL LOW (ref 8.9–10.3)
Chloride: 101 mmol/L (ref 98–111)
Creatinine, Ser: 0.6 mg/dL (ref 0.44–1.00)
GFR, Estimated: >60 mL/min (ref >60)
Glucose, Bld: 115 mg/dL — ABNORMAL HIGH (ref 70–99)
Potassium: 3.9 mmol/L (ref 3.5–5.1)
Sodium: 137 mmol/L (ref 135–145)

## 2020-09-13 LAB — URINALYSIS, COMPLETE (UACMP) WITH MICROSCOPIC
Bacteria, UA: NONE SEEN
Bilirubin Urine: NEGATIVE
Glucose, UA: NEGATIVE mg/dL
Hgb urine dipstick: NEGATIVE
Ketones, ur: NEGATIVE mg/dL
Leukocytes,Ua: NEGATIVE
Nitrite: NEGATIVE
Protein, ur: NEGATIVE mg/dL
Specific Gravity, Urine: 1.013 (ref 1.005–1.030)
pH: 5 (ref 5.0–8.0)

## 2020-09-13 LAB — BRAIN NATRIURETIC PEPTIDE: B Natriuretic Peptide: 12.7 pg/mL (ref 0.0–100.0)

## 2020-09-13 LAB — CBC
HCT: 43.5 % (ref 36.0–46.0)
Hemoglobin: 15 g/dL (ref 12.0–15.0)
MCH: 29.9 pg (ref 26.0–34.0)
MCHC: 34.5 g/dL (ref 30.0–36.0)
MCV: 86.7 fL (ref 80.0–100.0)
Platelets: 259 10*3/uL (ref 150–400)
RBC: 5.02 MIL/uL (ref 3.87–5.11)
RDW: 14.1 % (ref 11.5–15.5)
WBC: 8.5 10*3/uL (ref 4.0–10.5)
nRBC: 0 % (ref 0.0–0.2)

## 2020-09-13 LAB — HEPATIC FUNCTION PANEL
ALT: 19 U/L (ref 0–44)
AST: 24 U/L (ref 15–41)
Albumin: 3.3 g/dL — ABNORMAL LOW (ref 3.5–5.0)
Alkaline Phosphatase: 85 U/L (ref 38–126)
Bilirubin, Direct: <0.1 mg/dL (ref 0.0–0.2)
Total Bilirubin: 0.7 mg/dL (ref 0.3–1.2)
Total Protein: 6.4 g/dL — ABNORMAL LOW (ref 6.5–8.1)

## 2020-09-13 LAB — RESP PANEL BY RT-PCR (FLU A&B, COVID) ARPGX2
Influenza A by PCR: NEGATIVE
Influenza B by PCR: NEGATIVE
SARS Coronavirus 2 by RT PCR: NEGATIVE

## 2020-09-13 LAB — PROTIME-INR
INR: 1 (ref 0.8–1.2)
Prothrombin Time: 12.7 seconds (ref 11.4–15.2)

## 2020-09-13 LAB — TROPONIN I (HIGH SENSITIVITY)
Troponin I (High Sensitivity): 4 ng/L (ref <18)
Troponin I (High Sensitivity): 4 ng/L (ref <18)

## 2020-09-13 LAB — SEDIMENTATION RATE: Sed Rate: 4 mm/hr (ref 0–30)

## 2020-09-13 MED ORDER — THIAMINE HCL 100 MG/ML IJ SOLN
100.0000 mg | Freq: Every day | INTRAMUSCULAR | Status: DC
  Filled 2020-09-13: qty 2

## 2020-09-13 MED ORDER — HYDRALAZINE HCL 20 MG/ML IJ SOLN: 5.0000 mg | INTRAMUSCULAR | Status: DC | PRN

## 2020-09-13 MED ORDER — FUROSEMIDE 10 MG/ML IJ SOLN
20.0000 mg | Freq: Two times a day (BID) | INTRAMUSCULAR | Status: DC
  Administered 2020-09-14: 20 mg via INTRAVENOUS
  Filled 2020-09-13: qty 2

## 2020-09-13 MED ORDER — LORAZEPAM 2 MG/ML IJ SOLN: 1.0000 mg | INTRAMUSCULAR | Status: DC | PRN

## 2020-09-13 MED ORDER — HEPARIN SODIUM (PORCINE) 5000 UNIT/ML IJ SOLN
5000.0000 [IU] | Freq: Three times a day (TID) | INTRAMUSCULAR | Status: DC
  Administered 2020-09-13 – 2020-09-14 (×2): 5000 [IU] via SUBCUTANEOUS
  Filled 2020-09-13 (×2): qty 1

## 2020-09-13 MED ORDER — FOLIC ACID 1 MG PO TABS
1.0000 mg | ORAL_TABLET | Freq: Every day | ORAL | Status: DC
  Administered 2020-09-14: 1 mg via ORAL
  Filled 2020-09-13: qty 1

## 2020-09-13 MED ORDER — ALBUTEROL SULFATE HFA 108 (90 BASE) MCG/ACT IN AERS
2.0000 | INHALATION_SPRAY | RESPIRATORY_TRACT | Status: DC | PRN
Start: 2020-09-13 — End: 2020-09-13

## 2020-09-13 MED ORDER — LORAZEPAM 2 MG/ML IJ SOLN: 0.0000 mg | Freq: Two times a day (BID) | INTRAMUSCULAR | Status: DC

## 2020-09-13 MED ORDER — ONDANSETRON HCL 4 MG/2ML IJ SOLN: 4.0000 mg | Freq: Three times a day (TID) | INTRAMUSCULAR | Status: DC | PRN

## 2020-09-13 MED ORDER — LORAZEPAM 1 MG PO TABS
1.0000 mg | ORAL_TABLET | ORAL | Status: DC | PRN
Start: 2020-09-13 — End: 2020-09-14

## 2020-09-13 MED ORDER — NICOTINE 21 MG/24HR TD PT24
21.0000 mg | MEDICATED_PATCH | Freq: Every day | TRANSDERMAL | Status: DC
  Administered 2020-09-13 – 2020-09-14 (×2): 21 mg via TRANSDERMAL
  Filled 2020-09-13 (×2): qty 1

## 2020-09-13 MED ORDER — THIAMINE HCL 100 MG PO TABS
100.0000 mg | ORAL_TABLET | Freq: Every day | ORAL | Status: DC
  Administered 2020-09-14: 100 mg via ORAL
  Filled 2020-09-13: qty 1

## 2020-09-13 MED ORDER — LORAZEPAM 2 MG/ML IJ SOLN: 0.0000 mg | Freq: Four times a day (QID) | INTRAMUSCULAR | Status: DC

## 2020-09-13 MED ORDER — ALBUTEROL SULFATE (2.5 MG/3ML) 0.083% IN NEBU: 2.5000 mg | INHALATION_SOLUTION | RESPIRATORY_TRACT | Status: DC | PRN

## 2020-09-13 MED ORDER — FUROSEMIDE 10 MG/ML IJ SOLN
60.0000 mg | Freq: Once | INTRAMUSCULAR | Status: AC
  Administered 2020-09-13: 60 mg via INTRAVENOUS
  Filled 2020-09-13: qty 8

## 2020-09-13 MED ORDER — PANTOPRAZOLE SODIUM 20 MG PO TBEC
20.0000 mg | DELAYED_RELEASE_TABLET | Freq: Every day | ORAL | Status: DC | PRN
  Filled 2020-09-13: qty 1

## 2020-09-13 MED ORDER — ACETAMINOPHEN 325 MG PO TABS
650.0000 mg | ORAL_TABLET | Freq: Four times a day (QID) | ORAL | Status: DC | PRN
  Administered 2020-09-13: 650 mg via ORAL
  Filled 2020-09-13: qty 2

## 2020-09-13 MED ORDER — ADULT MULTIVITAMIN W/MINERALS CH
1.0000 | ORAL_TABLET | Freq: Every day | ORAL | Status: DC
  Administered 2020-09-14: 1 via ORAL
  Filled 2020-09-13: qty 1

## 2020-09-13 MED ORDER — AMLODIPINE BESYLATE 5 MG PO TABS
5.0000 mg | ORAL_TABLET | Freq: Every day | ORAL | Status: DC
  Administered 2020-09-14: 5 mg via ORAL
  Filled 2020-09-13: qty 1

## 2020-09-13 MED ORDER — IOHEXOL 350 MG/ML SOLN
75.0000 mL | Freq: Once | INTRAVENOUS | Status: AC | PRN
  Administered 2020-09-13: 75 mL via INTRAVENOUS
  Filled 2020-09-13: qty 75

## 2020-09-13 MED ORDER — ASPIRIN EC 81 MG PO TBEC
81.0000 mg | DELAYED_RELEASE_TABLET | Freq: Every day | ORAL | Status: DC
  Administered 2020-09-14: 81 mg via ORAL
  Filled 2020-09-13: qty 1

## 2020-09-13 NOTE — H&P (Signed)
History and Physical    Peggy Pacheco [id number] DOB: [birth year] DOA: 09/13/2020  Referring MD/NP/PA:   PCP: [facility]   Patient coming from:  The patient is coming from home.  At baseline, pt is independent for most of ADL.        Chief Complaint: Bilateral leg edema and shortness of breath  HPI: Peggy Pacheco is a 60 y.o. female with medical history significant of GERD, alcohol abuse, tobacco abuse, who presents with bilateral leg edema and shortness of breath.  Patient states that she has been having bilateral lower leg edema for many years, which has worsened in the past several months.  She also reports shortness of breath and cough with little mucus production.  No fever or chills.  No wheezing.  Denies chest pain.  No nausea, vomiting, diarrhea or abdominal pain.  No symptoms of UTI.  She states that she has numbness in submandibular area and numbness in localized small areas of bilateral medial upper arms.  No arm weakness.  No facial droop or slurred speech.  ED Course: pt was found to have negative COVID PCR, WBC 8.5, troponin level 4, 4, BNP 12.7, liver function normal except for albumin 3.3, electrolytes renal function okay, temperature normal, blood pressure 150/86 --> 172/95,  heart rate 76, RR 18, oxygen saturation 98% on room air.  Lower extremity Doppler is negative for DVT.  Chest x-ray showed focal right middle lobe opacity.  CT angiogram is negative for PE, but showed right pleural effusion and pericardial effusion.  Patient is admitted to progressive bed as inpatient.  Dr. Sophronia Simas of cardiology is consulted.    Review of Systems:   General: no fevers, chills, no body weight gain, has fatigue HEENT: no blurry vision, hearing changes or sore throat Respiratory: has dyspnea, coughing, no wheezing CV: no chest pain, no palpitations GI: no nausea, vomiting, abdominal pain, diarrhea, constipation GU: no dysuria, burning on urination, increased urinary  frequency, hematuria  Ext: has leg edema Neuro: no unilateral weakness, numbness, or tingling, no vision change or hearing loss Skin: no rash, no skin tear. MSK: No muscle spasm, no deformity, no limitation of range of movement in spin Heme: No easy bruising.  Travel history: No recent long distant travel.  Allergy: No Known Allergies  Past Medical History:  Diagnosis Date   Alcohol use    GERD (gastroesophageal reflux disease)    Tobacco abuse     Past Surgical History:  Procedure Laterality Date   NO PAST SURGERIES      Social History:  reports that she has been smoking cigarettes. She has a 22.50 pack-year smoking history. She has never used smokeless tobacco. She reports current alcohol use of about 42.0 standard drinks per week. She reports that she does not currently use drugs.  Family History:  Family History  Adopted: Yes  Family history unknown: Yes     Prior to Admission medications   Medication Sig Start Date End Date Taking? Authorizing Provider  naproxen (NAPROSYN) 500 MG tablet Take 1 tablet (500 mg total) by mouth 2 (two) times daily. 07/30/19  Yes Melynda Ripple, MD  omeprazole (PRILOSEC) 20 MG capsule Take 20 mg by mouth daily.   Yes [provider]  cetirizine-pseudoephedrine (ZYRTEC-D) 5-120 MG tablet Take 1 tablet by mouth 2 (two) times daily. Patient not taking: Reported on 09/13/2020 05/03/20   Coral Spikes, DO  doxycycline (VIBRAMYCIN) 100 MG capsule Take 1 capsule (100 mg total) by  mouth 2 (two) times daily. Patient not taking: No sig reported 05/03/20   Coral Spikes, DO  promethazine-dextromethorphan (PROMETHAZINE-DM) 6.25-15 MG/5ML syrup Take 5 mLs by mouth 4 (four) times daily as needed for cough. Patient not taking: Reported on 09/13/2020 05/03/20   Coral Spikes, DO  fluticasone [facility]) 50 MCG/ACT nasal spray Place 2 sprays into both nostrils daily. 09/16/19 05/03/20  Melynda Ripple, MD    Physical Exam: Vitals:   09/13/20 1208 09/13/20  1211 09/13/20 1406 09/13/20 1647  BP: (!) 156/89  (!) 150/86 (!) 172/95  Pulse: 76  74 85  Resp: '18  16 17  '$ Temp: 97.8 F (36.6 C)     TempSrc: Oral     SpO2: 98%  98% 97%  Weight:  71.2 kg    Height:  '5\' 4"'$  (1.626 m)     General: Not in acute distress HEENT:       Eyes: PERRL, EOMI, no scleral icterus.       ENT: No discharge from the ears and nose, no pharynx injection, no tonsillar enlargement.        Neck: No JVD, no bruit, no mass felt. Heme: No neck lymph node enlargement. Cardiac: S1/S2, RRR, No murmurs, No gallops or rubs. Respiratory:  has decreased air movement on the right side GI: Soft, nondistended, nontender, no rebound pain, no organomegaly, BS present. GU: No hematuria Ext: 3 + pitting leg edema bilaterally. 1+DP/PT pulse bilaterally. Musculoskeletal: No joint deformities, No joint redness or warmth, no limitation of ROM in spin. Skin: No rashes.  Neuro: Alert, oriented X3, cranial nerves II-XII grossly intact, moves all extremities normally.  Psych: Patient is not psychotic, no suicidal or hemocidal ideation.  Labs on Admission: I have personally reviewed following labs and imaging studies  CBC: Recent Labs  Lab 09/13/20 1215  WBC 8.5  HGB 15.0  HCT 43.5  MCV 86.7  PLT [id number]   Basic Metabolic Panel: Recent Labs  Lab 09/13/20 1215  NA 137  K 3.9  CL 101  CO2 28  GLUCOSE 115*  BUN 12  CREATININE 0.60  CALCIUM 8.7*   GFR: Estimated Creatinine Clearance: 73.3 mL/min (by C-G formula based on SCr of 0.6 mg/dL). Liver Function Tests: Recent Labs  Lab 09/13/20 1215  AST 24  ALT 19  ALKPHOS 85  BILITOT 0.7  PROT 6.4*  ALBUMIN 3.3*   No results for input(s): LIPASE, AMYLASE in the last 168 hours. No results for input(s): AMMONIA in the last 168 hours. Coagulation Profile: No results for input(s): INR, PROTIME in the last 168 hours. Cardiac Enzymes: No results for input(s): CKTOTAL, CKMB, CKMBINDEX, TROPONINI in the last 168 hours. BNP (last  3 results) No results for input(s): PROBNP in the last 8760 hours. HbA1C: No results for input(s): HGBA1C in the last 72 hours. CBG: No results for input(s): GLUCAP in the last 168 hours. Lipid Profile: No results for input(s): CHOL, HDL, LDLCALC, TRIG, CHOLHDL, LDLDIRECT in the last 72 hours. Thyroid Function Tests: No results for input(s): TSH, T4TOTAL, FREET4, T3FREE, THYROIDAB in the last 72 hours. Anemia Panel: No results for input(s): VITAMINB12, FOLATE, FERRITIN, TIBC, IRON, RETICCTPCT in the last 72 hours. Urine analysis: No results found for: COLORURINE, APPEARANCEUR, LABSPEC, PHURINE, GLUCOSEU, HGBUR, BILIRUBINUR, KETONESUR, PROTEINUR, UROBILINOGEN, NITRITE, LEUKOCYTESUR Sepsis Labs: '@LABRCNTIP'$ (procalcitonin:4,lacticidven:4) ) Recent Results (from the past 240 hour(s))  Resp Panel by RT-PCR (Flu A&B, Covid) Nasopharyngeal Swab     Status: None   Collection Time: 09/13/20  4:03 PM  Specimen: Nasopharyngeal Swab; Nasopharyngeal(NP) swabs in vial transport medium  Result Value Ref Range Status   SARS Coronavirus 2 by RT PCR NEGATIVE NEGATIVE Final    Comment: (NOTE) SARS-CoV-2 target nucleic acids are NOT DETECTED.  The SARS-CoV-2 RNA is generally detectable in upper respiratory specimens during the acute phase of infection. The lowest concentration of SARS-CoV-2 viral copies this assay can detect is 138 copies/mL. A negative result does not preclude SARS-Cov-2 infection and should not be used as the sole basis for treatment or other patient management decisions. A negative result may occur with  improper specimen collection/handling, submission of specimen other than nasopharyngeal swab, presence of viral mutation(s) within the areas targeted by this assay, and inadequate number of viral copies(<138 copies/mL). A negative result must be combined with clinical observations, patient history, and epidemiological information. The expected result is Negative.  Fact Sheet  for Patients:  EntrepreneurPulse.com.au  Fact Sheet for Healthcare Providers:  IncredibleEmployment.be  This test is no t yet approved or cleared by the [place] FDA and  has been authorized for detection and/or diagnosis of SARS-CoV-2 by FDA under an Emergency Use Authorization (EUA). This EUA will remain  in effect (meaning this test can be used) for the duration of the COVID-19 declaration under Section 564(b)(1) of the Act, 21 U.S.C.section 360bbb-3(b)(1), unless the authorization is terminated  or revoked sooner.       Influenza A by PCR NEGATIVE NEGATIVE Final   Influenza B by PCR NEGATIVE NEGATIVE Final    Comment: (NOTE) The Xpert Xpress SARS-CoV-2/FLU/RSV plus assay is intended as an aid in the diagnosis of influenza from Nasopharyngeal swab specimens and should not be used as a sole basis for treatment. Nasal washings and aspirates are unacceptable for Xpert Xpress SARS-CoV-2/FLU/RSV testing.  Fact Sheet for Patients: EntrepreneurPulse.com.au  Fact Sheet for Healthcare Providers: IncredibleEmployment.be  This test is not yet approved or cleared by the [place] FDA and has been authorized for detection and/or diagnosis of SARS-CoV-2 by FDA under an Emergency Use Authorization (EUA). This EUA will remain in effect (meaning this test can be used) for the duration of the COVID-19 declaration under Section 564(b)(1) of the Act, 21 U.S.C. section 360bbb-3(b)(1), unless the authorization is terminated or revoked.  Performed at [facility], [address] [place], [place] [address]      Radiological Exams on Admission: DG Chest 2 View  Result Date: 09/13/2020 CLINICAL DATA:  Chest pain, dyspnea EXAM: CHEST - 2 VIEW COMPARISON:  09/07/2012 FINDINGS: Focal pulmonary infiltrate has developed within the right middle lobe, possibly infectious in the appropriate clinical setting.  Small right pleural effusion. No pneumothorax. No pleural effusion on the left. Cardiac size within normal limits. Pulmonary vascularity is normal. No acute bone abnormality. IMPRESSION: Focal right middle lobe pulmonary infiltrate, possibly infectious, with small associated right parapneumonic effusion. Electronically Signed   By: Fidela Salisbury M.D.   On: 09/13/2020 13:09   CT Angio Chest PE W and/or Wo Contrast  Result Date: 09/13/2020 CLINICAL DATA:  Bilateral lower extremity edema, short of breath, chest pain EXAM: CT ANGIOGRAPHY CHEST WITH CONTRAST TECHNIQUE: Multidetector CT imaging of the chest was performed using the standard protocol during bolus administration of intravenous contrast. Multiplanar CT image reconstructions and MIPs were obtained to evaluate the vascular anatomy. CONTRAST:  81m OMNIPAQUE IOHEXOL 350 MG/ML SOLN COMPARISON:  09/13/2020 FINDINGS: Cardiovascular: This is a technically adequate evaluation of the pulmonary vasculature. No filling defects or pulmonary emboli. There is moderate pericardial effusion  measuring up to 1.2 cm in thickness. No evidence of tamponade. Normal caliber of the thoracic aorta without dissection. Minimal atherosclerosis of the aorta. Mediastinum/Nodes: No enlarged mediastinal, hilar, or axillary lymph nodes. Thyroid gland, trachea, and esophagus demonstrate no significant findings. Lungs/Pleura: Right pleural effusion measures approximately 1 L in size. Dependent ground-glass consolidation within the right lower lobe costophrenic angle likely hypoventilatory. There is subsegmental atelectasis within the right middle lobe. Left chest is clear. Central airways are patent. Upper Abdomen: There is diffuse hepatic steatosis. No other acute upper abdominal findings. Musculoskeletal: No acute or destructive bony lesions. There is diffuse body wall edema. Reconstructed images demonstrate no additional findings. Review of the MIP images confirms the above findings.  IMPRESSION: 1. No evidence of pulmonary embolus. 2. Moderate pericardial effusion. 3. Right pleural effusion, volume estimated 1 L. 4. Dependent right lower lobe consolidation favor atelectasis. 5. Diffuse body wall edema. 6. Hepatic steatosis. 7.  Aortic Atherosclerosis (ICD10-I70.0). Electronically Signed   By: Randa Ngo M.D.   On: 09/13/2020 15:15   US Venous Img Lower Bilateral  Result Date: 09/13/2020 CLINICAL DATA:  Lower extremity swelling x1 month. EXAM: BILATERAL LOWER EXTREMITY VENOUS DOPPLER ULTRASOUND TECHNIQUE: Gray-scale sonography with graded compression, as well as color Doppler and duplex ultrasound were performed to evaluate the lower extremity deep venous systems from the level of the common femoral vein and including the common femoral, femoral, profunda femoral, popliteal and calf veins including the posterior tibial, peroneal and gastrocnemius veins when visible. The superficial great saphenous vein was also interrogated. Spectral Doppler was utilized to evaluate flow at rest and with distal augmentation maneuvers in the common femoral, femoral and popliteal veins. COMPARISON:  LEFT lower extremity venous duplex, 02/20/2017 FINDINGS: RIGHT LOWER EXTREMITY VENOUS Normal compressibility of the common femoral, superficial femoral, and popliteal veins, as well as the visualized calf veins. Visualized portions of profunda femoral vein and great saphenous vein unremarkable. No filling defects to suggest DVT on grayscale or color Doppler imaging. Doppler waveforms show normal direction of venous flow, normal respiratory plasticity and response to augmentation. Limited views of the contralateral common femoral vein are unremarkable. OTHER No evidence of superficial thrombophlebitis or abnormal fluid collection. Dominant RIGHT inguinal lymph node, measuring up to 1.0 cm in greatest short axis diameter. Limitations: none LEFT LOWER EXTREMITY VENOUS Normal compressibility of the common femoral,  superficial femoral, and popliteal veins, as well as the visualized calf veins. Visualized portions of profunda femoral vein and great saphenous vein unremarkable. No filling defects to suggest DVT on grayscale or color Doppler imaging. Doppler waveforms show normal direction of venous flow, normal respiratory plasticity and response to augmentation. Limited views of the contralateral common femoral vein are unremarkable. OTHER No evidence of superficial thrombophlebitis or abnormal fluid collection. Enlarged LEFT inguinal lymph node, measuring up to 0.8 cm in greatest short axis diameter Limitations: none IMPRESSION: 1. No evidence of femoropopliteal DVT within either lower extremity. 2. Dominant bilateral inguinal lymph nodes. Michaelle Birks, MD [facility] Electronically Signed   By: Michaelle Birks M.D.   On: 09/13/2020 14:55     EKG: I have personally reviewed.  Sinus rhythm, QTC 441, low voltage, poor R wave progression  Assessment/Plan Principal Problem:   Bilateral leg edema Active Problems:   Pericardial effusion   Pleural effusion on right   Numbness in both arms and submandibular area   GERD (gastroesophageal reflux disease)   Alcohol use   SOB (shortness of breath)  Hypertension   Bilateral leg edema and SOB: Etiology is not clear, potential differential diagnosis including alcoholic cardiomyopathy, alcoholic liver cirrhosis.  Renal function normal, low suspicions for nephrology etiology.  Cardiology is consulted, Dr. Sophronia Simas recommended low-dose Lasix and 2D echo.  Lung is clear to auscultation.  No wheezing or rhonchi  -Will admit to progressive unit as inpatient -Lasix 20 mg bid by IV (patient received 60 mg of Lasix in ED) -2d echo -Daily weights -strict I/O's -Low salt diet -Fluid restriction -Obtain REDs Vest reading -As needed albuterol -check UA to r/o proteinuria  Pericardial effusion: No signs of tamponade. -On  IV Lasix as above  Pleural effusion on right: -IV Lasix, if not responding to IV Lasix may consider thoracentesis  Numbness in both arms and submandibular area: Etiology is not clear.  Her numbness in the arms are in small area, very localized, very low suspicions for neurologic etiology -Observe closely  GERD (gastroesophageal reflux disease) -Protonix  Tobacco abuse and Alcohol abuse: -Did counseling about importance of quitting smoking and alcohol -Nicotine patch -CIWA protocol  Hypertension: Her blood pressure is 152/88, 172/95.  Patient does not have history of hypertension, may have undiagnosed hypertension. -Add low-dose amlodipine 5 mg daily -IV hydralazine prn     DVT ppx: SQ Heparin    Code Status: Full code Family Communication: not done, no family member is at bed side.    Disposition Plan:  Anticipate discharge back to previous environment Consults called: Dr. Fletcher Anon of cardiology Admission status and  Level of care: Progressive Cardiac:  as inpt       Status is: Inpatient  Remains inpatient appropriate because:Inpatient level of care appropriate due to severity of illness  Dispo: The patient is from: Home              Anticipated d/c is to: Home              Patient currently is not medically stable to d/c.   Difficult to place patient No          Date of Service 09/13/2020    [facility]   If 7PM-7AM, please contact night-coverage [email or web address] 09/13/2020, 5:30 PM

## 2020-09-13 NOTE — ED Triage Notes (Signed)
Pt to ED via POV from home. Pt c/o bilateral leg swelling. Pt endorses SOB and CP. Pt also states numbness and tingling under left breast and right side of face. Pt right side of face appears more swollen. Pt states she believes she got bit by a spider and also states house she is living in is currently being remodel.

## 2020-09-13 NOTE — Consult Note (Addendum)
Cardiology Consultation:   Patient ID: Peggy Pacheco MRN: [id number]; DOB: 19-Dec-1960  Admit date: 09/13/2020 Date of Consult: 09/13/2020  PCP:  [facility] Cardiologist: New   Patient Profile:   Peggy Pacheco is a 60 y.o. female with a hx of GERD, tobacco use, and alcohol abuse who is being seen 09/13/2020 for the evaluation of anasarca and pericardial effusion at the request of Dr. Quentin Cornwall.  History of Present Illness:   Ms. Peggy Pacheco has no prior cardiac history. No family cardiac history. She smokes half a pack per day. She drinks 6 beers a night. No drug use. She reports she sees a PCP regularly. Says she had a tooth infection in 2017 and since then she has intermittent staff infections on her legs with swelling.   The patient presented to the ER 09/13/20 for cough, lower leg swelling and cough. Says the cough started 3 months ago. Lower leg swelling began a month ago, but reports it has been off and on for many years. Says PCP has been following this some, unsure regarding LLE. Also has been feeling more short of breath for the last few months, worse on exertion. She denies recent fever, chills, weight loss, night sweats. Has some orthopnea, no pnd. She denies chest pain, no positional chest pain. Said she had a tooth infection and took her daughters amoxicillin 2 days ago. Also reports some arm numbness and swelling around the eyes.   In the ER BP 156/89, pulse 76, RR 18, O2 98%, afebrile. Labs showed potassium 3.9, glucose 115, scr 0.6, BUN 12, Albumin 3.3, WBC 12.7, Hgb 15, BNP 12. HS trop 4>4. CXR with possible infection right middle lobe with small right parapneumonic effusion. Chest CTA showed no PE, moderate pericardial effusion, Rt pleural effusion, dependent right lower lobe consolidation, aortica atherosclerosis. DVT study negative. Respiratory panel pending. EKG shows NSR 71bpm, low voltage, and nonspecific T wave changes, poor r wave  progression. Patient was admitted for further work-up.    Past Medical History:  Diagnosis Date   GERD (gastroesophageal reflux disease)     Past Surgical History:  Procedure Laterality Date   NO PAST SURGERIES       Home Medications:  Prior to Admission medications   Medication Sig Start Date End Date Taking? Authorizing Provider  naproxen (NAPROSYN) 500 MG tablet Take 1 tablet (500 mg total) by mouth 2 (two) times daily. 07/30/19  Yes Melynda Ripple, MD  omeprazole (PRILOSEC) 20 MG capsule Take 20 mg by mouth daily.   Yes [provider]  cetirizine-pseudoephedrine (ZYRTEC-D) 5-120 MG tablet Take 1 tablet by mouth 2 (two) times daily. Patient not taking: Reported on 09/13/2020 05/03/20   Coral Spikes, DO  doxycycline (VIBRAMYCIN) 100 MG capsule Take 1 capsule (100 mg total) by mouth 2 (two) times daily. Patient not taking: No sig reported 05/03/20   Coral Spikes, DO  promethazine-dextromethorphan (PROMETHAZINE-DM) 6.25-15 MG/5ML syrup Take 5 mLs by mouth 4 (four) times daily as needed for cough. Patient not taking: Reported on 09/13/2020 05/03/20   Coral Spikes, DO  fluticasone [facility]) 50 MCG/ACT nasal spray Place 2 sprays into both nostrils daily. 09/16/19 05/03/20  Melynda Ripple, MD    Inpatient Medications: Scheduled Meds:  furosemide  60 mg Intravenous Once   Continuous Infusions:  PRN Meds:   Allergies:   No Known Allergies  Social History:   Social History   Socioeconomic History   Marital status: Widowed  Spouse name: Not on file   Number of children: Not on file   Years of education: Not on file   Highest education level: Not on file  Occupational History   Not on file  Tobacco Use   Smoking status: Every Day    Packs/day: 0.50    Years: 45.00    Pack years: 22.50    Types: Cigarettes   Smokeless tobacco: Never  Vaping Use   Vaping Use: Never used  Substance and Sexual Activity   Alcohol use: Yes    Alcohol/week: 42.0 standard drinks     Types: 42 Cans of beer per week    Comment: 6 beers per night   Drug use: Not Currently   Sexual activity: Not on file  Other Topics Concern   Not on file  Social History Narrative   Not on file   Social Determinants of Health   Financial Resource Strain: Not on file  Food Insecurity: Not on file  Transportation Needs: Not on file  Physical Activity: Not on file  Stress: Not on file  Social Connections: Not on file  Intimate Partner Violence: Not on file    Family History:    Family History  Adopted: Yes  Family history unknown: Yes     ROS:  Please see the history of present illness.   All other ROS reviewed and negative.     Physical Exam/Data:   Vitals:   09/13/20 1208 09/13/20 1211 09/13/20 1406  BP: (!) 156/89  (!) 150/86  Pulse: 76  74  Resp: 18  16  Temp: 97.8 F (36.6 C)    TempSrc: Oral    SpO2: 98%  98%  Weight:  71.2 kg   Height:  '5\' 4"'$  (1.626 m)    No intake or output data in the 24 hours ending 09/13/20 1533 Last 3 Weights 09/13/2020 05/03/2020 09/16/2019  Weight (lbs) 157 lb 148 lb 149 lb 14.6 oz  Weight (kg) 71.215 kg 67.132 kg 68 kg     Body mass index is 26.95 kg/m.  General:  Well nourished, well developed, in no acute distress HEENT: normal Lymph: no adenopathy Neck: minimal JVD Endocrine:  No thryomegaly Vascular: No carotid bruits; FA pulses 2+ bilaterally without bruits  Cardiac:  normal S1, S2; RRR; no murmur  Lungs:  diminished on the right  Abd: soft, nontender, no hepatomegaly  Ext: 1-2+ pedal edema Musculoskeletal:  No deformities, BUE and BLE strength normal and equal Skin: warm and dry  Neuro:  CNs 2-12 intact, no focal abnormalities noted Psych:  Normal affect   EKG:  The EKG was personally reviewed and demonstrates:  NSR, 71bpm, low voltage, nonspecific T wave changes, ?inverted p waves, poor r wave progression Telemetry:  Telemetry was personally reviewed and demonstrates:  N/A  Relevant CV Studies:  Echo  ordered  Laboratory Data:  High Sensitivity Troponin:   Recent Labs  Lab 09/13/20 1215 09/13/20 1403  TROPONINIHS 4 4     Chemistry Recent Labs  Lab 09/13/20 1215  NA 137  K 3.9  CL 101  CO2 28  GLUCOSE 115*  BUN 12  CREATININE 0.60  CALCIUM 8.7*  GFRNONAA >60  ANIONGAP 8    Recent Labs  Lab 09/13/20 1215  PROT 6.4*  ALBUMIN 3.3*  AST 24  ALT 19  ALKPHOS 85  BILITOT 0.7   Hematology Recent Labs  Lab 09/13/20 1215  WBC 8.5  RBC 5.02  HGB 15.0  HCT 43.5  MCV 86.7  MCH 29.9  MCHC 34.5  RDW 14.1  PLT 259   BNP Recent Labs  Lab 09/13/20 1215  BNP 12.7    DDimer No results for input(s): DDIMER in the last 168 hours.   Radiology/Studies:  DG Chest 2 View  Result Date: 09/13/2020 CLINICAL DATA:  Chest pain, dyspnea EXAM: CHEST - 2 VIEW COMPARISON:  09/07/2012 FINDINGS: Focal pulmonary infiltrate has developed within the right middle lobe, possibly infectious in the appropriate clinical setting. Small right pleural effusion. No pneumothorax. No pleural effusion on the left. Cardiac size within normal limits. Pulmonary vascularity is normal. No acute bone abnormality. IMPRESSION: Focal right middle lobe pulmonary infiltrate, possibly infectious, with small associated right parapneumonic effusion. Electronically Signed   By: Fidela Salisbury M.D.   On: 09/13/2020 13:09   CT Angio Chest PE W and/or Wo Contrast  Result Date: 09/13/2020 CLINICAL DATA:  Bilateral lower extremity edema, short of breath, chest pain EXAM: CT ANGIOGRAPHY CHEST WITH CONTRAST TECHNIQUE: Multidetector CT imaging of the chest was performed using the standard protocol during bolus administration of intravenous contrast. Multiplanar CT image reconstructions and MIPs were obtained to evaluate the vascular anatomy. CONTRAST:  57m OMNIPAQUE IOHEXOL 350 MG/ML SOLN COMPARISON:  09/13/2020 FINDINGS: Cardiovascular: This is a technically adequate evaluation of the pulmonary vasculature. No filling  defects or pulmonary emboli. There is moderate pericardial effusion measuring up to 1.2 cm in thickness. No evidence of tamponade. Normal caliber of the thoracic aorta without dissection. Minimal atherosclerosis of the aorta. Mediastinum/Nodes: No enlarged mediastinal, hilar, or axillary lymph nodes. Thyroid gland, trachea, and esophagus demonstrate no significant findings. Lungs/Pleura: Right pleural effusion measures approximately 1 L in size. Dependent ground-glass consolidation within the right lower lobe costophrenic angle likely hypoventilatory. There is subsegmental atelectasis within the right middle lobe. Left chest is clear. Central airways are patent. Upper Abdomen: There is diffuse hepatic steatosis. No other acute upper abdominal findings. Musculoskeletal: No acute or destructive bony lesions. There is diffuse body wall edema. Reconstructed images demonstrate no additional findings. Review of the MIP images confirms the above findings. IMPRESSION: 1. No evidence of pulmonary embolus. 2. Moderate pericardial effusion. 3. Right pleural effusion, volume estimated 1 L. 4. Dependent right lower lobe consolidation favor atelectasis. 5. Diffuse body wall edema. 6. Hepatic steatosis. 7.  Aortic Atherosclerosis (ICD10-I70.0). Electronically Signed   By: MRanda NgoM.D.   On: 09/13/2020 15:15   UK[place]Venous Img Lower Bilateral  Result Date: 09/13/2020 CLINICAL DATA:  Lower extremity swelling x1 month. EXAM: BILATERAL LOWER EXTREMITY VENOUS DOPPLER ULTRASOUND TECHNIQUE: Gray-scale sonography with graded compression, as well as color Doppler and duplex ultrasound were performed to evaluate the lower extremity deep venous systems from the level of the common femoral vein and including the common femoral, femoral, profunda femoral, popliteal and calf veins including the posterior tibial, peroneal and gastrocnemius veins when visible. The superficial great saphenous vein was also interrogated. Spectral Doppler  was utilized to evaluate flow at rest and with distal augmentation maneuvers in the common femoral, femoral and popliteal veins. COMPARISON:  LEFT lower extremity venous duplex, 02/20/2017 FINDINGS: RIGHT LOWER EXTREMITY VENOUS Normal compressibility of the common femoral, superficial femoral, and popliteal veins, as well as the visualized calf veins. Visualized portions of profunda femoral vein and great saphenous vein unremarkable. No filling defects to suggest DVT on grayscale or color Doppler imaging. Doppler waveforms show normal direction of venous flow, normal respiratory plasticity and response to augmentation. Limited views of the contralateral common femoral vein are unremarkable.  OTHER No evidence of superficial thrombophlebitis or abnormal fluid collection. Dominant RIGHT inguinal lymph node, measuring up to 1.0 cm in greatest short axis diameter. Limitations: none LEFT LOWER EXTREMITY VENOUS Normal compressibility of the common femoral, superficial femoral, and popliteal veins, as well as the visualized calf veins. Visualized portions of profunda femoral vein and great saphenous vein unremarkable. No filling defects to suggest DVT on grayscale or color Doppler imaging. Doppler waveforms show normal direction of venous flow, normal respiratory plasticity and response to augmentation. Limited views of the contralateral common femoral vein are unremarkable. OTHER No evidence of superficial thrombophlebitis or abnormal fluid collection. Enlarged LEFT inguinal lymph node, measuring up to 0.8 cm in greatest short axis diameter Limitations: none IMPRESSION: 1. No evidence of femoropopliteal DVT within either lower extremity. 2. Dominant bilateral inguinal lymph nodes. Michaelle Birks, MD [facility] Radiology Electronically Signed   By: Michaelle Birks M.D.   On: 09/13/2020 14:55     Assessment and Plan:   SOB/LLE/Volume overload - Patient presents with 3 months  of a cough with 1 month of worsening DOE and LLE.  work up in the ER shows normal BNP and troponin. LFTs normal and CBC unremarkable. vitals stable.  - CXR with right middle lobe infiltrate with small parapneumonic effusion. CTA chest negative for PE, moderate pericardial effusion, right pleural effusion, right lower lob consolidation favoring atelectasis, hepatic steatosis. - DVT study negative - Respiratory panel negative - Patient reports intermittent LLE for years, but has been worse more recently. Also with orthopnea. On exam she had 1-2+ pedal edema. - Will check an echo -  Reports significant alcohol use with low albumin, may also be contributing to LLE/volume overload - Would trial low dose IV lasix '20mg'$  BID  - monitor strict I/Os, daily weights, and creatinine  Pericardial effusion - moderate by chest CTA with no signs of tamponade, continue to monitor - she denies recent fever, chills ,night sweats, weight loss. Says she might've had a tooth infection last week and took her daughters antibiotics for this. - Tele ordered - check CRP and sed rate - will check an echo  Alcohol use - patient admits to drinking 6 beers a night since her 20s - albumin 3.3, possibly contributing to volume overload - cessation advised - may need CIWA per IM  Tobacco use - smokes 1/2 ppd, likely contributing to SOB - cessation advised   For questions or updates, please contact [facility] Please consult [email or web address] for contact info under    Signed, Chayanne Filippi Ninfa Meeker, PA-C  09/13/2020 3:33 PM

## 2020-09-13 NOTE — ED Provider Notes (Signed)
[facility] Emergency Department Provider Note    Event Date/Time   First MD Initiated Contact with Patient 09/13/20 1335     (approximate)  I have reviewed the triage vital signs and the nursing notes.   HISTORY  Chief Complaint Leg Swelling    HPI Peggy Pacheco is a 60 y.o. female with the below listed past medical history presents to the ER for evaluation of several days of cough shortness of breath and leg swelling.  States she is also having some tingling sensation in her right arm and shoulderdiaphoresis or sweating.  Does smoke.  She is currently remodeling a house.  States that the swelling in her legs has happened once before does not feel like it ever truly went away.  Not currently on a diuretic.  No other new medications.  Past Medical History:  Diagnosis Date   Alcohol use    GERD (gastroesophageal reflux disease)    Tobacco abuse    Family History  Adopted: Yes  Family history unknown: Yes   Past Surgical History:  Procedure Laterality Date   NO PAST SURGERIES     Patient Active Problem List   Diagnosis Date Noted   Pericardial effusion 09/13/2020   Pleural effusion on right 09/13/2020   Anasarca 09/13/2020   Right facial numbness 09/13/2020   Tobacco abuse 09/13/2020   Alcohol use 09/13/2020   GERD (gastroesophageal reflux disease)       Prior to Admission medications   Medication Sig Start Date End Date Taking? Authorizing Provider  naproxen (NAPROSYN) 500 MG tablet Take 1 tablet (500 mg total) by mouth 2 (two) times daily. 07/30/19  Yes Melynda Ripple, MD  omeprazole (PRILOSEC) 20 MG capsule Take 20 mg by mouth daily.   Yes [provider]  cetirizine-pseudoephedrine (ZYRTEC-D) 5-120 MG tablet Take 1 tablet by mouth 2 (two) times daily. Patient not taking: Reported on 09/13/2020 05/03/20   Coral Spikes, DO  doxycycline (VIBRAMYCIN) 100 MG capsule Take 1 capsule (100 mg total) by mouth 2 (two) times  daily. Patient not taking: No sig reported 05/03/20   Coral Spikes, DO  promethazine-dextromethorphan (PROMETHAZINE-DM) 6.25-15 MG/5ML syrup Take 5 mLs by mouth 4 (four) times daily as needed for cough. Patient not taking: Reported on 09/13/2020 05/03/20   Coral Spikes, DO  fluticasone [facility]) 50 MCG/ACT nasal spray Place 2 sprays into both nostrils daily. 09/16/19 05/03/20  Melynda Ripple, MD    Allergies Patient has no known allergies.    Social History Social History   Tobacco Use   Smoking status: Every Day    Packs/day: 0.50    Years: 45.00    Pack years: 22.50    Types: Cigarettes   Smokeless tobacco: Never  Vaping Use   Vaping Use: Never used  Substance Use Topics   Alcohol use: Yes    Alcohol/week: 42.0 standard drinks    Types: 42 Cans of beer per week    Comment: 6 beers per night   Drug use: Not Currently    Review of Systems Patient denies headaches, rhinorrhea, blurry vision, numbness, shortness of breath, chest pain, edema, cough, abdominal pain, nausea, vomiting, diarrhea, dysuria, fevers, rashes or hallucinations unless otherwise stated above in HPI. ____________________________________________   PHYSICAL EXAM:  VITAL SIGNS: Vitals:   09/13/20 1208 09/13/20 1406  BP: (!) 156/89 (!) 150/86  Pulse: 76 74  Resp: 18 16  Temp: 97.8 F (36.6 C)   SpO2: 98% 98%  Constitutional: Alert and oriented.  Eyes: Conjunctivae are normal.  Head: Atraumatic. Nose: No congestion/rhinnorhea. Mouth/Throat: Mucous membranes are moist.   Neck: No stridor. Painless ROM.  Cardiovascular: Normal rate, regular rhythm. Grossly normal heart sounds.  Good peripheral circulation. Respiratory: Normal respiratory effort.  No retractions. Lungs with coarse bs throughout. Gastrointestinal: Soft and nontender. No distention. No abdominal bruits. No CVA tenderness. Genitourinary:  Musculoskeletal: No lower extremity tenderness, 2+ BLE edema.  No joint effusions. Neurologic:   Normal speech and language. No gross focal neurologic deficits are appreciated. No facial droop Skin:  Skin is warm, dry and intact. No rash noted. Psychiatric: Mood and affect are normal. Speech and behavior are normal.  ____________________________________________   LABS (all labs ordered are listed, but only abnormal results are displayed)  Results for orders placed or performed during the hospital encounter of 09/13/20 (from the past 24 hour(s))  Basic metabolic panel     Status: Abnormal   Collection Time: 09/13/20 12:15 PM  Result Value Ref Range   Sodium 137 135 - 145 mmol/L   Potassium 3.9 3.5 - 5.1 mmol/L   Chloride 101 98 - 111 mmol/L   CO2 28 22 - 32 mmol/L   Glucose, Bld 115 (H) 70 - 99 mg/dL   BUN 12 6 - 20 mg/dL   Creatinine, Ser 0.60 0.44 - 1.00 mg/dL   Calcium 8.7 (L) 8.9 - 10.3 mg/dL   GFR, Estimated >60 >60 mL/min   Anion gap 8 5 - 15  CBC     Status: None   Collection Time: 09/13/20 12:15 PM  Result Value Ref Range   WBC 8.5 4.0 - 10.5 K/uL   RBC 5.02 3.87 - 5.11 MIL/uL   Hemoglobin 15.0 12.0 - 15.0 g/dL   HCT 43.5 36.0 - 46.0 %   MCV 86.7 80.0 - 100.0 fL   MCH 29.9 26.0 - 34.0 pg   MCHC 34.5 30.0 - 36.0 g/dL   RDW 14.1 11.5 - 15.5 %   Platelets 259 150 - 400 K/uL   nRBC 0.0 0.0 - 0.2 %  Troponin I (High Sensitivity)     Status: None   Collection Time: 09/13/20 12:15 PM  Result Value Ref Range   Troponin I (High Sensitivity) 4 <18 ng/L  Brain natriuretic peptide     Status: None   Collection Time: 09/13/20 12:15 PM  Result Value Ref Range   B Natriuretic Peptide 12.7 0.0 - 100.0 pg/mL  Hepatic function panel     Status: Abnormal   Collection Time: 09/13/20 12:15 PM  Result Value Ref Range   Total Protein 6.4 (L) 6.5 - 8.1 g/dL   Albumin 3.3 (L) 3.5 - 5.0 g/dL   AST 24 15 - 41 U/L   ALT 19 0 - 44 U/L   Alkaline Phosphatase 85 38 - 126 U/L   Total Bilirubin 0.7 0.3 - 1.2 mg/dL   Bilirubin, Direct <0.1 0.0 - 0.2 mg/dL   Indirect Bilirubin NOT  CALCULATED 0.3 - 0.9 mg/dL  Troponin I (High Sensitivity)     Status: None   Collection Time: 09/13/20  2:03 PM  Result Value Ref Range   Troponin I (High Sensitivity) 4 <18 ng/L   ____________________________________________  EKG My review and personal interpretation at Time: 12:13   Indication: sob  Rate: 70  Rhythm: sinus Axis: normal Other: normal intervals, no stemi, no depressions ____________________________________________  RADIOLOGY  I personally reviewed all radiographic images ordered to evaluate for the above acute complaints and  reviewed radiology reports and findings.  These findings were personally discussed with the patient.  Please see medical record for radiology report.  ____________________________________________   PROCEDURES  Procedure(s) performed:  Procedures    Critical Care performed: no ____________________________________________   INITIAL IMPRESSION / ASSESSMENT AND PLAN / ED COURSE  Pertinent labs & imaging results that were available during my care of the patient were reviewed by me and considered in my medical decision making (see chart for details).   DDX: Pneumonia, mass, DVT, PE, COPD, CHF  TENASIA TOMANEK is a 60 y.o. who presents to the ED with presentation as described above.  Given her constellation of findings of chest x-ray no white count or fever I am concerned for possible malignancy possible PE DVT.  CT imaging as well as lower extremity ultrasound ordered.  We will add on blood work to evaluate for CHF.  Clinical Course as of 09/13/20 1628  Mon Sep 13, 2020  1543 Discussed case in consultation with cardiology, Dr. Fletcher Anon, regarding pericardial effusion and pleural effusion findings concerning for anasarca.  Given lack of history of CHF is recommended admission for IV diuresis as well as echo cardiogram and further work-up.  Have discussed with the patient and available family all diagnostics and treatments performed thus far and  all questions were answered to the best of my ability. The patient demonstrates understanding and agreement with plan.  [PR]    Clinical Course User Index [PR] Merlyn Lot, MD    The patient was evaluated in Emergency Department today for the symptoms described in the history of present illness. He/she was evaluated in the context of the global COVID-19 pandemic, which necessitated consideration that the patient might be at risk for infection with the SARS-CoV-2 virus that causes COVID-19. Institutional protocols and algorithms that pertain to the evaluation of patients at risk for COVID-19 are in a state of rapid change based on information released by regulatory bodies including the CDC and federal and state organizations. These policies and algorithms were followed during the patient's care in the ED.  As part of my medical decision making, I reviewed the following data within the [facility] notes reviewed and incorporated, Labs reviewed, notes from prior ED visits and [place] Controlled Substance Database   ____________________________________________   FINAL CLINICAL IMPRESSION(S) / ED DIAGNOSES  Final diagnoses:  Peripheral edema  Pericardial effusion  Pleural effusion      NEW MEDICATIONS STARTED DURING THIS VISIT:  New Prescriptions   No medications on file     Note:  This document was prepared using Dragon voice recognition software and may include unintentional dictation errors.    Merlyn Lot, MD 09/13/20 [phone]

## 2020-09-14 ENCOUNTER — Telehealth: Payer: Self-pay | Admitting: Physician Assistant

## 2020-09-14 ENCOUNTER — Inpatient Hospital Stay (HOSPITAL_COMMUNITY)
Admit: 2020-09-14 | Discharge: 2020-09-14 | Disposition: A | Discharge status: Home / Self Care | Payer: BC Managed Care – PPO | Attending: Internal Medicine | Admitting: Internal Medicine

## 2020-09-14 DIAGNOSIS — I3139 Other pericardial effusion (noninflammatory): Secondary | ICD-10-CM

## 2020-09-14 DIAGNOSIS — R609 Edema, unspecified: Secondary | ICD-10-CM

## 2020-09-14 DIAGNOSIS — R600 Localized edema: Secondary | ICD-10-CM

## 2020-09-14 DIAGNOSIS — I313 Pericardial effusion (noninflammatory): Secondary | ICD-10-CM

## 2020-09-14 DIAGNOSIS — J90 Pleural effusion, not elsewhere classified: Secondary | ICD-10-CM

## 2020-09-14 LAB — ECHOCARDIOGRAM COMPLETE
AR max vel: 1.84 cm2
AV Area VTI: 1.62 cm2
AV Area mean vel: 1.81 cm2
AV Mean grad: 6 mmHg
AV Peak grad: 9.4 mmHg
Ao pk vel: 1.53 m/s
Area-P 1/2: 5.54 cm2
Height: 64 in
MV VTI: 2.25 cm2
S' Lateral: 3 cm
Weight: 2401.6 oz

## 2020-09-14 LAB — LIPID PANEL
Cholesterol: 170 mg/dL (ref 0–200)
HDL: 62 mg/dL (ref >40)
LDL Cholesterol: 92 mg/dL (ref 0–99)
Total CHOL/HDL Ratio: 2.7 RATIO
Triglycerides: 81 mg/dL (ref <150)
VLDL: 16 mg/dL (ref 0–40)

## 2020-09-14 LAB — HEMOGLOBIN A1C
Hgb A1c MFr Bld: 5.9 % — ABNORMAL HIGH (ref 4.8–5.6)
Mean Plasma Glucose: 122.63 mg/dL

## 2020-09-14 LAB — BASIC METABOLIC PANEL
Anion gap: 8 (ref 5–15)
BUN: 11 mg/dL (ref 6–20)
CO2: 27 mmol/L (ref 22–32)
Calcium: 8.5 mg/dL — ABNORMAL LOW (ref 8.9–10.3)
Chloride: 101 mmol/L (ref 98–111)
Creatinine, Ser: 0.52 mg/dL (ref 0.44–1.00)
GFR, Estimated: >60 mL/min (ref >60)
Glucose, Bld: 96 mg/dL (ref 70–99)
Potassium: 3.4 mmol/L — ABNORMAL LOW (ref 3.5–5.1)
Sodium: 136 mmol/L (ref 135–145)

## 2020-09-14 LAB — HIV ANTIBODY (ROUTINE TESTING W REFLEX): HIV Screen 4th Generation wRfx: NONREACTIVE

## 2020-09-14 LAB — HEPATITIS PANEL, ACUTE
HCV Ab: NONREACTIVE
Hep A IgM: NONREACTIVE
Hep B C IgM: NONREACTIVE
Hepatitis B Surface Ag: NONREACTIVE

## 2020-09-14 LAB — MAGNESIUM: Magnesium: 1.9 mg/dL (ref 1.7–2.4)

## 2020-09-14 LAB — C-REACTIVE PROTEIN: CRP: 3 mg/dL — ABNORMAL HIGH (ref <1.0)

## 2020-09-14 MED ORDER — FUROSEMIDE 20 MG PO TABS: 20.0000 mg | ORAL_TABLET | Freq: Every day | ORAL | 0 refills | Status: DC

## 2020-09-14 MED ORDER — POTASSIUM CHLORIDE CRYS ER 20 MEQ PO TBCR
40.0000 meq | EXTENDED_RELEASE_TABLET | Freq: Once | ORAL | Status: AC
  Administered 2020-09-14: 40 meq via ORAL
  Filled 2020-09-14: qty 4

## 2020-09-14 NOTE — Plan of Care (Signed)
  Problem: Education: Goal: Knowledge of General Education information will improve Description: Including pain rating scale, medication(s)/side effects and non-pharmacologic comfort measures Outcome: Adequate for Discharge   

## 2020-09-14 NOTE — Progress Notes (Signed)
Patient discharged to home via wheelchair, accompanied by family. Discharge teaching completed and included: new medication regimen, new medication info sheet, follow-up appointment information, diet restrictions, and MD note for work. Patient and daughter verbalize understanding of all discharge instructions and ask appropriate questions. Patient prefers to make her own PCP appointment.

## 2020-09-14 NOTE — Telephone Encounter (Signed)
   End, Harrell Gave, MD  P Cv Div [facility] Scheduling; P C[facility] Div [facility] Triage Good morning,   Could you arrange for this patient to be seen in 2 to 3 weeks by Dr. Fletcher Anon or an APP?  She will likely be discharged home later today.  She should also have a limited echo to reassess pericardial effusion around that time of her f/u visit.  Thanks.   Chris End   Order placed for limited echo.

## 2020-09-14 NOTE — Progress Notes (Signed)
*  PRELIMINARY RESULTS* Echocardiogram 2D Echocardiogram has been performed.  Peggy Pacheco 09/14/2020, 9:02 AM

## 2020-09-14 NOTE — Progress Notes (Signed)
Progress Note  Patient Name: Peggy Pacheco Date of Encounter: 09/14/2020  [facility] Cardiologist: Thurston Hole  Subjective   Patient reports she is feeling a little better. Breathing is mildly better and lower leg swelling improved. Denies chest pain. Still coughing. UOP -1.1L.  Inpatient Medications    Scheduled Meds:  amLODipine  5 mg Oral Daily   aspirin EC  81 mg Oral Daily   folic acid  1 mg Oral Daily   furosemide  20 mg Intravenous BID   heparin  5,000 Units Subcutaneous Q8H   LORazepam  0-4 mg Intravenous Q6H   Followed by   Derrill Memo ON 09/15/2020] LORazepam  0-4 mg Intravenous Q12H   multivitamin with minerals  1 tablet Oral Daily   nicotine  21 mg Transdermal Daily   thiamine  100 mg Oral Daily   Or   thiamine  100 mg Intravenous Daily   Continuous Infusions:  PRN Meds: acetaminophen, albuterol, hydrALAZINE, LORazepam **OR** LORazepam, ondansetron (ZOFRAN) IV, pantoprazole   Vital Signs    Vitals:   09/14/20 0004 09/14/20 0526 09/14/20 0530 09/14/20 0730  BP: 105/66  125/80 115/61  Pulse: 95  89 89  Resp: '17  17 18  '$ Temp: 98.2 F (36.8 C)  98.1 F (36.7 C) 98.8 F (37.1 C)  TempSrc:    Oral  SpO2: 97%  97% 95%  Weight:  68.1 kg    Height:        Intake/Output Summary (Last 24 hours) at 09/14/2020 0803 Last data filed at 09/14/2020 0532 Gross per 24 hour  Intake 300 ml  Output 1400 ml  Net -1100 ml   Last 3 Weights 09/14/2020 09/13/2020 09/13/2020  Weight (lbs) 150 lb 1.6 oz 153 lb 4.8 oz 157 lb  Weight (kg) 68.085 kg 69.536 kg 71.215 kg      Telemetry    SR/ST- HR 80-100 - Personally Reviewed  ECG    No new - Personally Reviewed  Physical Exam   GEN: No acute distress.   Neck: No JVD Cardiac: RRR, no murmurs, rubs, or gallops.  Respiratory: Clear to auscultation bilaterally. GI: Soft, nontender, non-distended  MS: No edema; No deformity. Neuro:  Nonfocal  Psych: Normal affect   Labs    High Sensitivity Troponin:   Recent  Labs  Lab 09/13/20 1215 09/13/20 1403  TROPONINIHS 4 4      Chemistry Recent Labs  Lab 09/13/20 1215 09/14/20 0409  NA 137 136  K 3.9 3.4*  CL 101 101  CO2 28 27  GLUCOSE 115* 96  BUN 12 11  CREATININE 0.60 0.52  CALCIUM 8.7* 8.5*  PROT 6.4*  --   ALBUMIN 3.3*  --   AST 24  --   ALT 19  --   ALKPHOS 85  --   BILITOT 0.7  --   GFRNONAA >60 >60  ANIONGAP 8 8     Hematology Recent Labs  Lab 09/13/20 1215  WBC 8.5  RBC 5.02  HGB 15.0  HCT 43.5  MCV 86.7  MCH 29.9  MCHC 34.5  RDW 14.1  PLT 259    BNP Recent Labs  Lab 09/13/20 1215  BNP 12.7     DDimer No results for input(s): DDIMER in the last 168 hours.   Radiology    DG Chest 2 View  Result Date: 09/13/2020 CLINICAL DATA:  Chest pain, dyspnea EXAM: CHEST - 2 VIEW COMPARISON:  09/07/2012 FINDINGS: Focal pulmonary infiltrate has developed within the right middle lobe,  possibly infectious in the appropriate clinical setting. Small right pleural effusion. No pneumothorax. No pleural effusion on the left. Cardiac size within normal limits. Pulmonary vascularity is normal. No acute bone abnormality. IMPRESSION: Focal right middle lobe pulmonary infiltrate, possibly infectious, with small associated right parapneumonic effusion. Electronically Signed   By: Fidela Salisbury M.D.   On: 09/13/2020 13:09   CT Angio Chest PE W and/or Wo Contrast  Result Date: 09/13/2020 CLINICAL DATA:  Bilateral lower extremity edema, short of breath, chest pain EXAM: CT ANGIOGRAPHY CHEST WITH CONTRAST TECHNIQUE: Multidetector CT imaging of the chest was performed using the standard protocol during bolus administration of intravenous contrast. Multiplanar CT image reconstructions and MIPs were obtained to evaluate the vascular anatomy. CONTRAST:  17m OMNIPAQUE IOHEXOL 350 MG/ML SOLN COMPARISON:  09/13/2020 FINDINGS: Cardiovascular: This is a technically adequate evaluation of the pulmonary vasculature. No filling defects or pulmonary  emboli. There is moderate pericardial effusion measuring up to 1.2 cm in thickness. No evidence of tamponade. Normal caliber of the thoracic aorta without dissection. Minimal atherosclerosis of the aorta. Mediastinum/Nodes: No enlarged mediastinal, hilar, or axillary lymph nodes. Thyroid gland, trachea, and esophagus demonstrate no significant findings. Lungs/Pleura: Right pleural effusion measures approximately 1 L in size. Dependent ground-glass consolidation within the right lower lobe costophrenic angle likely hypoventilatory. There is subsegmental atelectasis within the right middle lobe. Left chest is clear. Central airways are patent. Upper Abdomen: There is diffuse hepatic steatosis. No other acute upper abdominal findings. Musculoskeletal: No acute or destructive bony lesions. There is diffuse body wall edema. Reconstructed images demonstrate no additional findings. Review of the MIP images confirms the above findings. IMPRESSION: 1. No evidence of pulmonary embolus. 2. Moderate pericardial effusion. 3. Right pleural effusion, volume estimated 1 L. 4. Dependent right lower lobe consolidation favor atelectasis. 5. Diffuse body wall edema. 6. Hepatic steatosis. 7.  Aortic Atherosclerosis (ICD10-I70.0). Electronically Signed   By: MRanda NgoM.D.   On: 09/13/2020 15:15   UKoreaVenous Img Lower Bilateral  Result Date: 09/13/2020 CLINICAL DATA:  Lower extremity swelling x1 month. EXAM: BILATERAL LOWER EXTREMITY VENOUS DOPPLER ULTRASOUND TECHNIQUE: Gray-scale sonography with graded compression, as well as color Doppler and duplex ultrasound were performed to evaluate the lower extremity deep venous systems from the level of the common femoral vein and including the common femoral, femoral, profunda femoral, popliteal and calf veins including the posterior tibial, peroneal and gastrocnemius veins when visible. The superficial great saphenous vein was also interrogated. Spectral Doppler was utilized to  evaluate flow at rest and with distal augmentation maneuvers in the common femoral, femoral and popliteal veins. COMPARISON:  LEFT lower extremity venous duplex, 02/20/2017 FINDINGS: RIGHT LOWER EXTREMITY VENOUS Normal compressibility of the common femoral, superficial femoral, and popliteal veins, as well as the visualized calf veins. Visualized portions of profunda femoral vein and great saphenous vein unremarkable. No filling defects to suggest DVT on grayscale or color Doppler imaging. Doppler waveforms show normal direction of venous flow, normal respiratory plasticity and response to augmentation. Limited views of the contralateral common femoral vein are unremarkable. OTHER No evidence of superficial thrombophlebitis or abnormal fluid collection. Dominant RIGHT inguinal lymph node, measuring up to 1.0 cm in greatest short axis diameter. Limitations: none LEFT LOWER EXTREMITY VENOUS Normal compressibility of the common femoral, superficial femoral, and popliteal veins, as well as the visualized calf veins. Visualized portions of profunda femoral vein and great saphenous vein unremarkable. No filling defects to suggest DVT on grayscale or color Doppler imaging. Doppler waveforms  show normal direction of venous flow, normal respiratory plasticity and response to augmentation. Limited views of the contralateral common femoral vein are unremarkable. OTHER No evidence of superficial thrombophlebitis or abnormal fluid collection. Enlarged LEFT inguinal lymph node, measuring up to 0.8 cm in greatest short axis diameter Limitations: none IMPRESSION: 1. No evidence of femoropopliteal DVT within either lower extremity. 2. Dominant bilateral inguinal lymph nodes. Michaelle Birks, MD [facility] Radiology Electronically Signed   By: Michaelle Birks M.D.   On: 09/13/2020 14:55    Cardiac Studies   Echo ordered  Patient Profile     60 y.o. female with a hx of GERD, tobacco  use, and alcohol abuse who is being seen 09/13/2020 for the evaluation of anasarca and pericardial effusion.  Assessment & Plan    SOB/LLE/Volume overload - Patient presented with 3 months of a cough with 1 month of worsening DOE and LLE.  Work-up in the ER shows normal BNP and troponin. LFTs normal and CBC unremarkable. vitals stable.  - CXR with right middle lobe infiltrate with small parapneumonic effusion. CTA chest negative for PE, moderate pericardial effusion, right pleural effusion, right lower lob consolidation favoring atelectasis, hepatic steatosis. - DVT study negative - Respiratory panel negative - Patient reports intermittent LLE for years, but has been worse more recently. Echo ordered. -  Reports significant alcohol use with low albumin, may also be contributing to LLE/volume overload -  UOP -1.1L overnight. LLE improved with IV lasix. Kidney function stable.  - Continue IV lasix '20mg'$  BID  - monitor strict I/Os, daily weights, and creatinine - possible symptoms from inflammatory process, consider checking autoimmune dz   Pericardial effusion - moderate by chest CTA with no signs of tamponade, continue to monitor - she denies recent fever, chills ,night sweats, weight loss. No chest pain. Says she possibly had a tooth infection last week and took her daughters antibiotics for this. - CRP elevated to 3 and sed rate wnl. Consider autoimmune work-up as above.  - echo ordered    Alcohol use - patient admits to drinking 6 beers a night since her 69s - cessation advised - CIWA per IM   Tobacco use - smokes 1/2 ppd, likely contributing to SOB - cessation advised  For questions or updates, please contact [facility] Please consult [email or web address] for contact info under        Signed, Chaitanya Amedee Ninfa Meeker, PA-C  09/14/2020, 8:03 AM

## 2020-09-14 NOTE — Telephone Encounter (Signed)
No answer, no VM

## 2020-09-15 LAB — ANA COMPREHENSIVE PANEL
Anti JO-1: <0.2 AI (ref 0.0–0.9)
Centromere Ab Screen: <0.2 AI (ref 0.0–0.9)
Chromatin Ab SerPl-aCnc: 0.9 AI (ref 0.0–0.9)
ENA SM Ab Ser-aCnc: <0.2 AI (ref 0.0–0.9)
Ribonucleic Protein: <0.2 AI (ref 0.0–0.9)
SSA (Ro) (ENA) Antibody, IgG: 0.2 AI (ref 0.0–0.9)
SSB (La) (ENA) Antibody, IgG: <0.2 AI (ref 0.0–0.9)
Scleroderma (Scl-70) (ENA) Antibody, IgG: <0.2 AI (ref 0.0–0.9)
ds DNA Ab: <1 IU/mL (ref 0–9)

## 2020-09-15 LAB — ANA W/REFLEX IF POSITIVE: Anti Nuclear Antibody (ANA): NEGATIVE

## 2020-09-15 NOTE — Discharge Summary (Addendum)
[facility] [place] NAME: Peggy Pacheco    MR#:  [id number]  DATE OF BIRTH:  Aug 08, 1960  DATE OF ADMISSION:  09/13/2020   ADMITTING PHYSICIAN: Ivor Costa, MD  DATE OF DISCHARGE: 09/14/2020 12:45 PM  PRIMARY CARE PHYSICIAN: [place]   ADMISSION DIAGNOSIS:  Anasarca [R60.1] Pericardial effusion [I31.3] Peripheral edema [R60.9] Pleural effusion [J90] DISCHARGE DIAGNOSIS:  Principal Problem:   Bilateral leg edema Active Problems:   Pericardial effusion   Pleural effusion   Numbness in both arms and submandibular area   GERD (gastroesophageal reflux disease)   Tobacco abuse   Alcohol use   SOB (shortness of breath)   Hypertension   Peripheral edema  SECONDARY DIAGNOSIS:   Past Medical History:  Diagnosis Date   Alcohol use    GERD (gastroesophageal reflux disease)    Tobacco abuse    HOSPITAL COURSE:  60 year old female with a known history of GERD, alcohol and tobacco abuse admitted for anasarca.  Patient had worsening bilateral lower extremity edema, right pleural effusion and moderate pericardial effusion which were seen on chest CT.  Anasarca Moderate pericardial effusion and mild right pleural effusion With normal BNP, echo with EF of 60 to 65% and grade 1 diastolic dysfunction. Cardiology did see the patient but were not convinced this as acute heart failure. Patient was started on diuretic for symptomatic management.  She was recommended to have outpatient follow-up with rheumatology, GI, cardiology for further evaluation of her symptoms including but not limited to infectious/autoimmune work-up and evaluation of hepatic steatosis (Hepatitis and HIV neg) -As patient was not hypoxic and was able to function without any significant distress decision was made to discharge her home and have outpatient work-up.  Patient was in agreement with the discharge plan  GERD Continue PPI  Essential hypertension Started  furosemide   DISCHARGE CONDITIONS:  Stable CONSULTS OBTAINED:  Treatment Team:  Wellington Hampshire, MD DRUG ALLERGIES:  No Known Allergies DISCHARGE MEDICATIONS:   Allergies as of 09/14/2020   No Known Allergies      Medication List     STOP taking these medications    cetirizine-pseudoephedrine 5-120 MG tablet Commonly known as: ZYRTEC-D   doxycycline 100 MG capsule Commonly known as: VIBRAMYCIN   promethazine-dextromethorphan 6.25-15 MG/5ML syrup Commonly known as: PROMETHAZINE-DM       TAKE these medications    furosemide 20 MG tablet Commonly known as: Lasix Take 1 tablet (20 mg total) by mouth daily.   naproxen 500 MG tablet Commonly known as: NAPROSYN Take 1 tablet (500 mg total) by mouth 2 (two) times daily.   omeprazole 20 MG capsule Commonly known as: PRILOSEC Take 20 mg by mouth daily.       DISCHARGE INSTRUCTIONS:   DIET:  Cardiac diet DISCHARGE CONDITION:  Stable ACTIVITY:  Activity as tolerated OXYGEN:  Home Oxygen: No.  Oxygen Delivery: room air DISCHARGE LOCATION:  home   If you experience worsening of your admission symptoms, develop shortness of breath, life threatening emergency, suicidal or homicidal thoughts you must seek medical attention immediately by calling 911 or calling your MD immediately  if symptoms less severe.  You Must read complete instructions/literature along with all the possible adverse reactions/side effects for all the Medicines you take and that have been prescribed to you. Take any new Medicines after you have completely understood and accpet all the possible adverse reactions/side effects.   Please note  You were  cared for by a hospitalist during your hospital stay. If you have any questions about your discharge medications or the care you received while you were in the hospital after you are discharged, you can call the unit and asked to speak with the hospitalist on call if the hospitalist that took care  of you is not available. Once you are discharged, your primary care physician will handle any further medical issues. Please note that NO REFILLS for any discharge medications will be authorized once you are discharged, as it is imperative that you return to your primary care physician (or establish a relationship with a primary care physician if you do not have one) for your aftercare needs so that they can reassess your need for medications and monitor your lab values.    On the day of Discharge:  VITAL SIGNS:  Blood pressure 134/73, pulse 84, temperature 97.7 F (36.5 C), temperature source Oral, resp. rate 18, height '5\' 4"'$  (1.626 m), weight 68.1 kg, SpO2 95 %. PHYSICAL EXAMINATION:  GENERAL:  60 y.o.-year-old patient lying in the bed with no acute distress.  EYES: Pupils equal, round, reactive to light and accommodation. No scleral icterus. Extraocular muscles intact.  HEENT: Head atraumatic, normocephalic. Oropharynx and nasopharynx clear.  NECK:  Supple, no jugular venous distention. No thyroid enlargement, no tenderness.  LUNGS: Normal breath sounds bilaterally, no wheezing, rales,rhonchi or crepitation. No use of accessory muscles of respiration.  CARDIOVASCULAR: S1, S2 normal. No murmurs, rubs, or gallops.  ABDOMEN: Soft, non-tender, non-distended. Bowel sounds present. No organomegaly or mass.  EXTREMITIES: No pedal edema, cyanosis, or clubbing.  NEUROLOGIC: Cranial nerves II through XII are intact. Muscle strength 5/5 in all extremities. Sensation intact. Gait not checked.  PSYCHIATRIC: The patient is alert and oriented x 3.  SKIN: No obvious rash, lesion, or ulcer.  DATA REVIEW:   CBC Recent Labs  Lab 09/13/20 1215  WBC 8.5  HGB 15.0  HCT 43.5  PLT 259    Chemistries  Recent Labs  Lab 09/13/20 1215 09/14/20 0409  NA 137 136  K 3.9 3.4*  CL 101 101  CO2 28 27  GLUCOSE 115* 96  BUN 12 11  CREATININE 0.60 0.52  CALCIUM 8.7* 8.5*  MG  --  1.9  AST 24  --   ALT  19  --   ALKPHOS 85  --   BILITOT 0.7  --      Outpatient follow-up  Follow-up Information     [place]. Schedule an appointment as soon as possible for a visit in 2 day(s).   Why: [facility] Discharge F/UP.Marland Kitchen Patient will need to make a follow up appointment. Contact information: [place] [address] [phone]         Nelva Bush, MD. Schedule an appointment as soon as possible for a visit on 10/01/2020.   Specialty: Cardiology Why: [facility] Discharge F/UP.Marland Kitchen@ 10am Contact information: [address] [place] [address] [phone]         Virgel Manifold, MD. Schedule an appointment as soon as possible for a visit on 09/22/2020.   Specialty: Gastroenterology Why: [facility] Discharge F/UP.Marland Kitchen@ 2:30pm Contact information: [place] [address] [phone]         Quintin Alto, MD. Schedule an appointment as soon as possible for a visit in 1 week(s).   Specialty: Rheumatology Why: [facility] Discharge F/UP.Marland KitchenThe Contact information: [place]  [address] [phone]                 30 Day Unplanned Readmission Risk Score    Flowsheet Row ED to Hosp-Admission (Discharged) from 09/13/2020 in [facility]  30 Day Unplanned Readmission Risk Score (%) 7.98 Filed at 09/14/2020 1200       This score is the patient's risk of an unplanned readmission within 30 days of being discharged (0 -100%). The score is based on dignosis, age, lab data, medications, orders, and past utilization.   Low:  0-14.9   Medium: 15-21.9   High: 22-29.9   Extreme: 30 and above          Management plans discussed with the patient, family and they are in agreement.  CODE STATUS: Prior   TOTAL TIME TAKING CARE OF THIS PATIENT: 45 minutes.    Max Sane M.D on 09/15/2020 at 12:17 PM  [facility]   CC: Primary care physician;  [facility]   Note: This dictation was prepared with Dragon dictation along with smaller phrase technology. Any transcriptional errors that result from this process are unintentional.

## 2020-09-15 NOTE — Telephone Encounter (Signed)
Attempted to call pt, unable to make contact [phone] is not a working number [facility] on mobile, mail box full.  Pt has has appt with Christell Faith PA-C 9/30 ECHO has been order, but is not schedule at this time.  Will r/t scheduling to see if they can reach pt for ECHO appt. Hopefully before 9/30.

## 2020-09-16 NOTE — Telephone Encounter (Signed)
Attempted to schedule called both numbers no answer & unable to LVM

## 2020-09-20 NOTE — Telephone Encounter (Signed)
Attempted to call pt to schedule limited echo that is still needed prior to f/u 10/01/20.  Called both numbers listed. No answer.  Voicemail is full.

## 2020-09-22 ENCOUNTER — Ambulatory Visit: Payer: BC Managed Care – PPO | Admitting: Gastroenterology

## 2020-09-23 NOTE — Telephone Encounter (Signed)
Attempted to call pt x 3. No answer.  Voicemail is full. Unable to leave message.  Pt's follow up 10/01/20.  Needs echo scheduled prior to appt.  MyChart message sent to pt after third failed attempt to reach.

## 2020-09-29 NOTE — Progress Notes (Signed)
Cardiology Office Note    Date:  10/01/2020   ID:  Peggy Pacheco, Peggy Pacheco 27-Jul-1960, MRN [id number]  PCP:  [facility]  Cardiologist:  Kathlyn Sacramento, MD  Electrophysiologist:  None   Chief Complaint: Hospital follow-up  History of Present Illness:   Peggy Pacheco is a 60 y.o. female with history of pericardial effusion, aortic atherosclerosis, hepatic steatosis, HTN, significant alcohol use drinking 6 beers nightly since her 15s, and tobacco use who presents for hospital follow up after recent admission to [facility] from 9/12-9/13 as outlined below.   She was admitted to [facility] on 9/12 with leg swelling, right pleural effusion, anasarca, and a moderate pericardial effusion.  High sensitivity troponin negative x 2, BNP 12.  CTA chest was negative for PE with noted moderate pericardial effusion, right pleural effusion, dependent right lower lobe consolidation, diffuse body wall edema, hepatic steatosis, and aortic atherosclerosis.  There was no obvious cirrhosis.  Lower extremity ultrasound was negative for DVT bilaterally.  Echo showed an EF of 60-65%, no RWMA, Gr1DD, normal RV systolic function and ventricular cavity size with mildly increased wall thickness, small to moderate circumferential pericardial effusion without evidence of tamponade, trivial mitral regurgitation, mildly to moderate aortic valve sclerosis without evidence of stenosis, and an estimated right atrial pressure of 3 mmHg.  There was question of symptoms possibly being in the setting of autoimmune/inflammatory process vs infectious process.  Hepatitis and HIV negative.  With echo showing preserved LVSF with Gr1DD and labs showing a normal BNP, symptoms were not felt to be related to acute CHF.  From a cardiac perspective, she was discharged on low dose Lasix with recommendation for follow up limited echo.  She was advised to follow up with GI and PCP for ongoing evaluation of her symptoms.    She comes in doing well  today.  She notes her dyspnea has resolved following the removal of some dusty/moldy blinds in her house.  She does continue to note intermittent abdominal fullness.  No orthopnea, PND, frank angina, palpitations, dizziness, presyncope, or syncope.  She was taking Lasix 40 mg daily instead of 20 mg daily.  Her weight has remained stable.  She has cut out all alcohol.  She does continue to smoke 1/2 pack daily.  She does note a long history of "knots" in her bilateral lower extremities that wax and wane.  She indicates these have previously been treated with doxycycline.  They are of uncertain etiology.  Otherwise, she does not have any issues or concerns at this time.  She has a scheduled a follow-up appointment with a new PCP.  She did miss her GI appointment.   Labs independently reviewed: 09/2020 - magnesium 1.9, potassium 3.4, BUN 11, SCr 0.52, TC 170, TG 81, HDL 62, LDL 92, A1c 5.9, albumin 3.3, AST/ALT normal, HGB 15, PLT 259, INR normal  Past Medical History:  Diagnosis Date   Alcohol use    GERD (gastroesophageal reflux disease)    Tobacco abuse     Past Surgical History:  Procedure Laterality Date   NO PAST SURGERIES      Current Medications: Current Meds  Medication Sig   naproxen (NAPROSYN) 500 MG tablet Take 1 tablet (500 mg total) by mouth 2 (two) times daily.   omeprazole (PRILOSEC) 20 MG capsule Take 20 mg by mouth daily.   [DISCONTINUED] furosemide (LASIX) 20 MG tablet Take 1 tablet (20 mg total) by mouth daily.    Allergies:   Patient has  no known allergies.   Social History   Socioeconomic History   Marital status: Widowed    Spouse name: Not on file   Number of children: Not on file   Years of education: Not on file   Highest education level: Not on file  Occupational History   Not on file  Tobacco Use   Smoking status: Every Day    Packs/day: 0.50    Years: 45.00    Pack years: 22.50    Types: Cigarettes   Smokeless tobacco: Never  Vaping Use   Vaping  Use: Never used  Substance and Sexual Activity   Alcohol use: Yes    Alcohol/week: 42.0 standard drinks    Types: 42 Cans of beer per week    Comment: 6 beers per night   Drug use: Not Currently   Sexual activity: Not on file  Other Topics Concern   Not on file  Social History Narrative   Not on file   Social Determinants of Health   Financial Resource Strain: Not on file  Food Insecurity: Not on file  Transportation Needs: Not on file  Physical Activity: Not on file  Stress: Not on file  Social Connections: Not on file     Family History:  The patient's She was adopted. Family history is unknown by patient.  ROS:   ROS   EKGs/Labs/Other Studies Reviewed:    Studies reviewed were summarized above. The additional studies were reviewed today:  2d echo 09/14/2020:  1. Left ventricular ejection fraction, by estimation, is 60 to 65%. The  left ventricle has normal function. The left ventricle has no regional  wall motion abnormalities. Left ventricular diastolic parameters are  consistent with Grade I diastolic  dysfunction (impaired relaxation).   2. Right ventricular systolic function is normal. The right ventricular  size is normal. Mildly increased right ventricular wall thickness.   3. There is a small-moderate size pericardial effusion. The pericardial  effusion is circumferential. There is no evidence of cardiac tamponade.   4. The mitral valve is grossly normal. Trivial mitral valve  regurgitation.   5. The aortic valve has an indeterminant number of cusps. There is mild  calcification of the aortic valve. There is mild thickening of the aortic  valve. Aortic valve regurgitation is not visualized. Mild to moderate  aortic valve sclerosis/calcification  is present, without any evidence of aortic stenosis.   6. The inferior vena cava is normal in size with greater than 50%  respiratory variability, suggesting right atrial pressure of 3 mmHg.    EKG:  EKG is  ordered today.  The EKG ordered today demonstrates NSR, 97 bpm, low voltage QRS, possible septal infarct versus lead placement, no acute ST-T changes, when compared to prior tracing, voltages is improved and there are no significant changes  Recent Labs: 09/13/2020: ALT 19; B Natriuretic Peptide 12.7; Hemoglobin 15.0; Platelets 259 09/14/2020: BUN 11; Creatinine, Ser 0.52; Magnesium 1.9; Potassium 3.4; Sodium 136  Recent Lipid Panel    Component Value Date/Time   CHOL 170 09/14/2020 0409   TRIG 81 09/14/2020 0409   HDL 62 09/14/2020 0409   CHOLHDL 2.7 09/14/2020 0409   VLDL 16 09/14/2020 0409   LDLCALC 92 09/14/2020 0409    PHYSICAL EXAM:    VS:  BP (!) 150/90   Pulse 97   Ht 5\' 4"  (1.626 m)   Wt 158 lb 9.6 oz (71.9 kg)   SpO2 97%   BMI 27.22 kg/m   BMI: Body  mass index is 27.22 kg/m.  Physical Exam  Wt Readings from Last 3 Encounters:  10/01/20 158 lb 9.6 oz (71.9 kg)  09/14/20 150 lb 1.6 oz (68.1 kg)  05/03/20 148 lb (67.1 kg)     ASSESSMENT & PLAN:   Pericardial effusion/pleural effusion/anasarca: 2D echo during admission showed a preserved LV systolic function with only grade 1 diastolic dysfunction.  Presentation is not suspected to be a primary CHF event.  Possibly exacerbated by alcohol intake.  Dyspnea has resolved following the removal of dusty/moldy blinds.  She appears euvolemic on exam today.  Small to moderate circumferential pericardial effusion noted on 2D echo during admission without evidence of tamponade.  She remains on Lasix, though has been taking 40 mg daily up until yesterday where she started taking 20 mg daily. Repeat limited echo to evaluate pericardial effusion.  She did miss her GI appointment, though will have this rescheduled.  She has scheduled an appointment with a new PCP.  Cannot exclude some underlying rheumatologic/autoimmune process.  Check BMP on furosemide.  Alcohol/tobacco use: She has cut out all alcohol.  She continues to smoke 1/2 pack  daily.  Complete cessation is recommended.  Elevated blood pressure without diagnosis of hypertension.  Blood pressure in the hospital was well controlled to soft at times.  Recommend continued monitoring.  She remains on furosemide as above.  Disposition: F/u with Dr. Fletcher Anon or an APP in 1 month.   Medication Adjustments/Labs and Tests Ordered: Current medicines are reviewed at length with the patient today.  Concerns regarding medicines are outlined above. Medication changes, Labs and Tests ordered today are summarized above and listed in the Patient Instructions accessible in Encounters.   Signed, Christell Faith, PA-C 10/01/2020 11:57 AM     [place] [address] [place], [place] [address] [phone]

## 2020-10-01 ENCOUNTER — Ambulatory Visit (INDEPENDENT_AMBULATORY_CARE_PROVIDER_SITE_OTHER): Payer: BC Managed Care – PPO | Admitting: Physician Assistant

## 2020-10-01 ENCOUNTER — Other Ambulatory Visit: Payer: Self-pay

## 2020-10-01 ENCOUNTER — Encounter: Payer: Self-pay | Admitting: Physician Assistant

## 2020-10-01 VITALS — BP 150/90 | HR 97 | Ht 64.0 in | Wt 158.6 lb

## 2020-10-01 DIAGNOSIS — I3139 Other pericardial effusion (noninflammatory): Secondary | ICD-10-CM

## 2020-10-01 DIAGNOSIS — Z7289 Other problems related to lifestyle: Secondary | ICD-10-CM | POA: Diagnosis not present

## 2020-10-01 DIAGNOSIS — J90 Pleural effusion, not elsewhere classified: Secondary | ICD-10-CM | POA: Diagnosis not present

## 2020-10-01 DIAGNOSIS — Z720 Tobacco use: Secondary | ICD-10-CM

## 2020-10-01 DIAGNOSIS — R601 Generalized edema: Secondary | ICD-10-CM

## 2020-10-01 DIAGNOSIS — Z789 Other specified health status: Secondary | ICD-10-CM

## 2020-10-01 DIAGNOSIS — I313 Pericardial effusion (noninflammatory): Secondary | ICD-10-CM | POA: Diagnosis not present

## 2020-10-01 DIAGNOSIS — F1090 Alcohol use, unspecified, uncomplicated: Secondary | ICD-10-CM

## 2020-10-01 DIAGNOSIS — R030 Elevated blood-pressure reading, without diagnosis of hypertension: Secondary | ICD-10-CM

## 2020-10-01 MED ORDER — FUROSEMIDE 20 MG PO TABS: 20.0000 mg | ORAL_TABLET | Freq: Every day | ORAL | 1 refills | Status: DC

## 2020-10-01 NOTE — Telephone Encounter (Signed)
The patient saw Christell Faith, PA in the office today. Her echo is scheduled for 10/19/20.

## 2020-10-01 NOTE — Patient Instructions (Signed)
Medication Instructions:  Your physician recommends that you continue on your current medications as directed. Please refer to the Current Medication list given to you today.  Furosemide (lasix) has been refilled today.  *If you need a refill on your cardiac medications before your next appointment, please call your pharmacy*   Lab Work: Bmp today If you have labs (blood work) drawn today and your tests are completely normal, you will receive your results only by: [place] (if you have MyChart) OR A paper copy in the mail If you have any lab test that is abnormal or we need to change your treatment, we will call you to review the results.   Testing/Procedures: Your physician has requested that you have an echocardiogram. Echocardiography is a painless test that uses sound waves to create images of your heart. It provides your doctor with information about the size and shape of your heart and how well your heart's chambers and valves are working. This procedure takes approximately one hour. There are no restrictions for this procedure.    Follow-Up: At [facility], you and your health needs are our priority.  As part of our continuing mission to provide you with exceptional heart care, we have created designated Provider Care Teams.  These Care Teams include your primary Cardiologist (physician) and Advanced Practice Providers (APPs -  Physician Assistants and Nurse Practitioners) who all work together to provide you with the care you need, when you need it.  We recommend signing up for the patient portal called "MyChart".  Sign up information is provided on this After Visit Summary.  MyChart is used to connect with patients for Virtual Visits (Telemedicine).  Patients are able to view lab/test results, encounter notes, upcoming appointments, etc.  Non-urgent messages can be sent to your provider as well.   To learn more about what you can do with MyChart, go to  [email or web address].    Your next appointment:   4 week(s)  The format for your next appointment:   In Person  Provider:   You may see Kathlyn Sacramento, MD or one of the following Advanced Practice Providers on your designated Care Team:   Murray Hodgkins, NP Christell Faith, PA-C Marrianne Mood, PA-C Cadence Kathlen Mody, [place]   Other Instructions N/A

## 2020-10-02 LAB — BASIC METABOLIC PANEL
BUN/Creatinine Ratio: 26 — ABNORMAL HIGH (ref 9–23)
BUN: 19 mg/dL (ref 6–24)
CO2: 24 mmol/L (ref 20–29)
Calcium: 8.9 mg/dL (ref 8.7–10.2)
Chloride: 102 mmol/L (ref 96–106)
Creatinine, Ser: 0.73 mg/dL (ref 0.57–1.00)
Glucose: 128 mg/dL — ABNORMAL HIGH (ref 70–99)
Potassium: 4.6 mmol/L (ref 3.5–5.2)
Sodium: 141 mmol/L (ref 134–144)
eGFR: 95 mL/min/{1.73_m2} (ref >59)

## 2020-10-19 ENCOUNTER — Other Ambulatory Visit: Payer: Self-pay

## 2020-10-19 ENCOUNTER — Ambulatory Visit (INDEPENDENT_AMBULATORY_CARE_PROVIDER_SITE_OTHER): Payer: BC Managed Care – PPO

## 2020-10-19 DIAGNOSIS — I3139 Other pericardial effusion (noninflammatory): Secondary | ICD-10-CM | POA: Diagnosis not present

## 2020-10-19 LAB — ECHOCARDIOGRAM LIMITED
S' Lateral: 2.8 cm
Single Plane A4C EF: 53.4 %

## 2020-10-21 ENCOUNTER — Telehealth: Payer: Self-pay | Admitting: Internal Medicine

## 2020-10-21 NOTE — Telephone Encounter (Signed)
Nelva Bush, MD  P Cv Div [place] Triage Cc: Rise Mu, PA-C Please let Ms. Mcbreen know that her echocardiogram shows that her heart is contracting well.  There is a moderate pericardial effusion (fluid around the heart), which is stable or slightly larger compared with the prior echocardiogram last month.  I recommend that Ms. Jhaveri increase furosemide to 40 mg daily and follow-up as scheduled with Christell Faith, PA, in 1 week.

## 2020-10-21 NOTE — Telephone Encounter (Signed)
Called to give the patient echo. Unable to lmom x2. Home telephone, message sts that the call cannot be completed at this time Cell, voicemail is full unable to lmom.

## 2020-10-25 NOTE — Telephone Encounter (Signed)
2nd attempt to call the patient. Home #- no answer- received a message stating the call could not be completed at this time. Cell #- no answer- voice mail box is full

## 2020-10-26 NOTE — Telephone Encounter (Signed)
No answer/Voicemail box is full on mobile number

## 2020-10-27 NOTE — Telephone Encounter (Signed)
After third unsuccessful attempt to reach pt, letter mailed asking pt to call office to discuss results and Dr. Darnelle Bos recc.

## 2020-10-28 ENCOUNTER — Ambulatory Visit: Payer: BC Managed Care – PPO | Admitting: Nurse Practitioner

## 2020-11-01 ENCOUNTER — Other Ambulatory Visit: Payer: Self-pay

## 2020-11-02 MED ORDER — FUROSEMIDE 20 MG PO TABS: 20.0000 mg | ORAL_TABLET | Freq: Every day | ORAL | 3 refills | Status: DC

## 2020-11-08 MED ORDER — FUROSEMIDE 40 MG PO TABS: 40.0000 mg | ORAL_TABLET | Freq: Every day | ORAL | Status: DC

## 2020-11-08 NOTE — Addendum Note (Signed)
Addended by: Lamar Laundry on: 11/08/2020 09:49 AM   Modules accepted: Orders

## 2020-11-08 NOTE — Telephone Encounter (Signed)
Patient returning call.

## 2020-11-08 NOTE — Telephone Encounter (Addendum)
Patient made aware of echo results and Dr. Darnelle Bos recommendation. She will increase Lasix to 40 mg daily (pt sts that she does not need a refill at this time). Patient has been advised to keep her scheduled appt on 11/19/20 @ 3:10 pm with Ignacia Bayley, NP.  Patient verbalized understanding and voiced appreciation for the call back.

## 2020-11-19 ENCOUNTER — Other Ambulatory Visit: Payer: Self-pay

## 2020-11-19 ENCOUNTER — Encounter: Payer: Self-pay | Admitting: Nurse Practitioner

## 2020-11-19 ENCOUNTER — Ambulatory Visit (INDEPENDENT_AMBULATORY_CARE_PROVIDER_SITE_OTHER): Payer: BC Managed Care – PPO | Admitting: Nurse Practitioner

## 2020-11-19 VITALS — BP 122/78 | HR 94 | Ht 64.0 in | Wt 164.0 lb

## 2020-11-19 DIAGNOSIS — I5033 Acute on chronic diastolic (congestive) heart failure: Secondary | ICD-10-CM

## 2020-11-19 DIAGNOSIS — I3139 Other pericardial effusion (noninflammatory): Secondary | ICD-10-CM | POA: Diagnosis not present

## 2020-11-19 DIAGNOSIS — Z720 Tobacco use: Secondary | ICD-10-CM | POA: Diagnosis not present

## 2020-11-19 DIAGNOSIS — F1010 Alcohol abuse, uncomplicated: Secondary | ICD-10-CM

## 2020-11-19 NOTE — Patient Instructions (Addendum)
Medication Instructions:  Your physician has recommended you make the following change in your medication:   TAKE Furosemide 40 mg twice a day for 3 days THEN go back to 40 mg once a day   *If you need a refill on your cardiac medications before your next appointment, please call your pharmacy*   Lab Work: BMET today.  If you have labs (blood work) drawn today and your tests are completely normal, you will receive your results only by: [place] (if you have MyChart) OR A paper copy in the mail If you have any lab test that is abnormal or we need to change your treatment, we will call you to review the results.   Testing/Procedures: Your physician has requested that you have an echocardiogram in 1 month.   Echocardiography is a painless test that uses sound waves to create images of your heart. It provides your doctor with information about the size and shape of your heart and how well your heart's chambers and valves are working. This procedure takes approximately one hour. There are no restrictions for this procedure.    Follow-Up: At [facility], you and your health needs are our priority.  As part of our continuing mission to provide you with exceptional heart care, we have created designated Provider Care Teams.  These Care Teams include your primary Cardiologist (physician) and Advanced Practice Providers (APPs -  Physician Assistants and Nurse Practitioners) who all work together to provide you with the care you need, when you need it.   Your next appointment:   2-3 week(s)  The format for your next appointment:   In Person  Provider:   Kathlyn Sacramento, MD, Murray Hodgkins, NP, or Christell Faith, PA-C

## 2020-11-19 NOTE — Progress Notes (Signed)
Office Visit    Patient Name: Peggy Pacheco Date of Encounter: 11/19/2020  Primary Care Provider:  [facility] Primary Cardiologist:  Kathlyn Sacramento, MD  Chief Complaint    60 year old female with past medical history of pericardial effusion, aortic atherosclerosis, hepatic steatosis, HTN, ETOH/Tobacco abuse presents to clinic for her history of pericardial effusion.  Past Medical History    Past Medical History:  Diagnosis Date   Alcohol use    a. Quit 09/2020.   Diastolic dysfunction    GERD (gastroesophageal reflux disease)    Pericardial effusion    a.09/2020 Echo: EF 60-65%, no rwma, GrI DD, Nl RV fxn. Small-mod circumferential pericardial effusion. Triv TR. Mild-mod AoV sclerosis; b. 10/2020 Echo: EF 60-65%, no rwma. Mod circumferential pericardial effusion.   Tobacco abuse    a. Cutting back - 1/3-1/2 ppd.   Past Surgical History:  Procedure Laterality Date   NO PAST SURGERIES     Allergies  No Known Allergies  History of Present Illness    60 year old female with past medical history of pericardial effusion, aortic atherosclerosis, hepatic steatosis, HTN, and ETOH/Tobacco abuse.  She was admitted on 09/13/2020 with leg swelling, anasarca, and moderate pericardial effusion. HsTrop were negative and BNP of 12. CTA chest was performed and revealed moderate pericardial effusion, right pleural effusion, aortic atherosclerosis, and hepatic steatosis. Echo showed EF of 60-65% with grade 1 DD, sm to mod circumferential pericardial effusion w/o tamponade, trivial MR, mild to mod AV sclerosis. Due to preserved LV function and normal BNP it was unclear if symptoms were cardiac in origin and there was question of ? autoimmune or infectious process. She was discharged on 20 mg Lasix with follow-ups with PCP and GI. She was last seen in clinic on 10/01/2020 in which she was doing well. Her dyspnea had resolved and she had stopped all ETOH intake. Repeat echo on 10/18  showed stable moderate pericardial effusion and her Lasix was increased to 40 mg.   Today she says that she is feeling well. She has had a feeling of tightness in her abd since her admission that she says has finally gone away. Her weight is up 6 LBs from her last visit. She mostly prepares her own meals and has not had a good appetite over the last few weeks due to feelings of fullness. She denies chest pain, palpitations, pnd, orthopnea, n, v, dizziness, syncope.   Home Medications    Current Outpatient Medications  Medication Sig Dispense Refill   furosemide (LASIX) 40 MG tablet Take 1 tablet (40 mg total) by mouth daily. (Patient taking differently: Take 40 mg by mouth 2 (two) times daily.)     naproxen (NAPROSYN) 500 MG tablet Take 1 tablet (500 mg total) by mouth 2 (two) times daily. 20 tablet 0   omeprazole (PRILOSEC) 20 MG capsule Take 20 mg by mouth daily. (Patient not taking: Reported on 11/19/2020)     No current facility-administered medications for this visit.    Review of Systems    +++ early satiety.  She denies chest pain, palpitations, dyspnea, pnd, orthopnea, n, v, dizziness, syncope.   All other systems reviewed and are otherwise negative except as noted above.   Physical Exam    VS:  BP 122/78 (BP Location: Left Arm, Patient Position: Sitting, Cuff Size: Normal)   Pulse 94   Ht $R'5\' 4"'tq$  (1.626 m)   Wt 164 lb (74.4 kg)   SpO2 97%   BMI 28.15 kg/m  ,  BMI Body mass index is 28.15 kg/m.     GEN: Well nourished, well developed, in no acute distress. HEENT: normal. Neck: Supple, full - difficult to gauge jvp but at least mildly elevated.  No carotid bruits, or masses. Cardiac: RRR, no murmurs, rubs, or gallops. No clubbing, cyanosis.  Radials/PT 2+ and equal bilaterally. 1-2+ bilat woody. Respiratory:  Respirations regular and unlabored, clear to auscultation bilaterally. GI: Soft, nontender, nondistended, BS + x 4. MS: no deformity or atrophy. Skin: warm and dry, no  rash. Neuro:  Strength and sensation are intact. Psych: Normal affect.  Accessory Clinical Findings    Lab Results  Component Value Date   WBC 8.5 09/13/2020   HGB 15.0 09/13/2020   HCT 43.5 09/13/2020   MCV 86.7 09/13/2020   PLT 259 09/13/2020   Lab Results  Component Value Date   CREATININE 0.73 10/01/2020   BUN 19 10/01/2020   NA 141 10/01/2020   K 4.6 10/01/2020   CL 102 10/01/2020   CO2 24 10/01/2020   Lab Results  Component Value Date   ALT 19 09/13/2020   AST 24 09/13/2020   ALKPHOS 85 09/13/2020   BILITOT 0.7 09/13/2020   Lab Results  Component Value Date   CHOL 170 09/14/2020   HDL 62 09/14/2020   LDLCALC 92 09/14/2020   TRIG 81 09/14/2020   CHOLHDL 2.7 09/14/2020    Lab Results  Component Value Date   HGBA1C 5.9 (H) 09/13/2020    Assessment & Plan    1.  Moderate Circumferential Pericardial Effusion: Found on Echo during admission on 09/13/2020 after presenting with leg swelling and anasarca. Repeat Echo on 10/18 shows persistent moderate pericardial effusion. Her lasix dosage was increased from 20 to 40 mg for this reason and has been taking the new dose for approximately 2 weeks. Despite the increased dose she still has lower extremity woody edema and wt is up 6 lbs from last visit. Will increase her lasix to 40 mg BID over the next 3 day. Check a B-MET today to assess kidney function. Plan to setup a limited echo w/in the next month to evaluate pericardial effusion. Instructed to adhere to low salt/sodium diet and weight herself daily.   2.  Acute on chronic HFpEF:  as above, wt up 6 lbs w/ lower ext woody edema.  Notes compliance w/ lasix and diet, however plans to leave here to pick up [place] food on the way home.  Increasing lasix to 40 BID x 3 days (starting tonight), then will drop back down to 40 daily.  BMET today.  HR/BP stable.  3.  ETOH/Tobacco abuse: She has abstained from all alcohol. She is down to less than a 1/2 a pack a day smoking. She  says that a pack of cigarettes will usually last her 3-4 days. Complete cessation is recommended.   4. Disposition: BMET today.  Follow-up in 2 weeks or sooner if necessary.   Murray Hodgkins, NP 11/19/2020, 4:44 PM

## 2020-11-20 LAB — BASIC METABOLIC PANEL
BUN/Creatinine Ratio: 30 — ABNORMAL HIGH (ref 9–23)
BUN: 21 mg/dL (ref 6–24)
CO2: 27 mmol/L (ref 20–29)
Calcium: 9.3 mg/dL (ref 8.7–10.2)
Chloride: 99 mmol/L (ref 96–106)
Creatinine, Ser: 0.7 mg/dL (ref 0.57–1.00)
Glucose: 102 mg/dL — ABNORMAL HIGH (ref 70–99)
Potassium: 4.9 mmol/L (ref 3.5–5.2)
Sodium: 139 mmol/L (ref 134–144)
eGFR: 100 mL/min/{1.73_m2} (ref >59)

## 2020-11-24 ENCOUNTER — Ambulatory Visit (INDEPENDENT_AMBULATORY_CARE_PROVIDER_SITE_OTHER): Payer: BC Managed Care – PPO

## 2020-11-24 ENCOUNTER — Encounter: Payer: Self-pay | Admitting: Emergency Medicine

## 2020-11-24 ENCOUNTER — Other Ambulatory Visit: Payer: Self-pay

## 2020-11-24 ENCOUNTER — Ambulatory Visit
Admission: EM | Admit: 2020-11-24 | Discharge: 2020-11-24 | Disposition: A | Discharge status: Home / Self Care | Payer: BC Managed Care – PPO | Attending: Emergency Medicine | Admitting: Emergency Medicine

## 2020-11-24 DIAGNOSIS — M545 Low back pain, unspecified: Secondary | ICD-10-CM

## 2020-11-24 LAB — URINALYSIS, COMPLETE (UACMP) WITH MICROSCOPIC
Glucose, UA: NEGATIVE mg/dL
Hgb urine dipstick: NEGATIVE
Ketones, ur: NEGATIVE mg/dL
Leukocytes,Ua: NEGATIVE
Nitrite: NEGATIVE
Specific Gravity, Urine: >1.03 — ABNORMAL HIGH (ref 1.005–1.030)
pH: 5.5 (ref 5.0–8.0)

## 2020-11-24 MED ORDER — PREDNISONE 10 MG (21) PO TBPK: ORAL_TABLET | ORAL | 0 refills | Status: DC

## 2020-11-24 NOTE — ED Provider Notes (Signed)
[facility]    CSN: [id number] Arrival date & time: 11/24/20  1551      History   Chief Complaint Chief Complaint  Patient presents with   Back Pain   Urinary Frequency    HPI Peggy Pacheco is a 60 y.o. female.   HPI  60 year old female here for evaluation of low back pain and urinary frequency.  Patient reports that she has been experiencing urinary frequency since she was placed on Lasix approximately a month ago.  She has stated that there is a change in the odor and also adds a darker yellow.  Her low back pain has been going on for the last 2 to 3 weeks.  This is not associated with any injury or heavy lifting.  It is not made worse by movement.  Patient states she has tried over-the-counter naproxen and ice without any relief from the naproxen and some slight relief from the ice.  She states the pain did start on the right side but then moved across to both sides.  She denies any radiation into her hips or legs and she denies any numbness or tingling in either of her legs.  Patient also denies pain with urination, cloudiness to her urine, or blood in her urine.  Patient reports that she was started on Lasix for bilateral pleural effusions and pericardial effusion.  No history of CHF.  Past Medical History:  Diagnosis Date   Alcohol use    a. Quit 09/2020.   Diastolic dysfunction    GERD (gastroesophageal reflux disease)    Pericardial effusion    a.09/2020 Echo: EF 60-65%, no rwma, GrI DD, Nl RV fxn. Small-mod circumferential pericardial effusion. Triv TR. Mild-mod AoV sclerosis; b. 10/2020 Echo: EF 60-65%, no rwma. Mod circumferential pericardial effusion.   Tobacco abuse    a. Cutting back - 1/3-1/2 ppd.    Patient Active Problem List   Diagnosis Date Noted   Peripheral edema    Pericardial effusion 09/13/2020   Pleural effusion 09/13/2020   Bilateral leg edema 09/13/2020   Numbness in both arms and submandibular area 09/13/2020   Tobacco abuse  09/13/2020   Alcohol use 09/13/2020   SOB (shortness of breath) 09/13/2020   Hypertension 09/13/2020   GERD (gastroesophageal reflux disease)     Past Surgical History:  Procedure Laterality Date   NO PAST SURGERIES      OB History   No obstetric history on file.      Home Medications    Prior to Admission medications   Medication Sig Start Date End Date Taking? Authorizing Provider  furosemide (LASIX) 40 MG tablet Take 1 tablet (40 mg total) by mouth daily. Patient taking differently: Take 40 mg by mouth 2 (two) times daily. 11/08/20 02/06/21 Yes End, Harrell Gave, MD  naproxen (NAPROSYN) 500 MG tablet Take 1 tablet (500 mg total) by mouth 2 (two) times daily. 07/30/19  Yes Melynda Ripple, MD  predniSONE (STERAPRED UNI-PAK 21 TAB) 10 MG (21) TBPK tablet Take 6 tablets on day 1, 5 tablets day 2, 4 tablets day 3, 3 tablets day 4, 2 tablets day 5, 1 tablet day 6 11/24/20  Yes Margarette Canada, NP  omeprazole (PRILOSEC) 20 MG capsule Take 20 mg by mouth daily. Patient not taking: Reported on 11/19/2020    [provider]  fluticasone (FLONASE) 50 MCG/ACT nasal spray Place 2 sprays into both nostrils daily. 09/16/19 05/03/20  Melynda Ripple, MD    Family History Family History  Adopted: Yes  Family history unknown: Yes    Social History Social History   Tobacco Use   Smoking status: Every Day    Packs/day: 0.50    Years: 45.00    Pack years: 22.50    Types: Cigarettes   Smokeless tobacco: Never  Vaping Use   Vaping Use: Never used  Substance Use Topics   Alcohol use: Yes    Alcohol/week: 42.0 standard drinks    Types: 42 Cans of beer per week    Comment: 6 beers per night   Drug use: Not Currently     Allergies   Patient has no known allergies.   Review of Systems Review of Systems  Constitutional:  Negative for activity change, appetite change and fever.  Genitourinary:  Positive for frequency. Negative for dysuria, hematuria and urgency.   Musculoskeletal:  Positive for back pain.  Skin:  Negative for rash.  Neurological:  Negative for weakness and numbness.  Hematological: Negative.   Psychiatric/Behavioral: Negative.      Physical Exam Triage Vital Signs ED Triage Vitals [11/24/20 1721]  Enc Vitals Group     BP (!) 157/95     Pulse Rate 77     Resp      Temp 97.8 F (36.6 C)     Temp Source Oral     SpO2 100 %     Weight      Height      Head Circumference      Peak Flow      Pain Score 8     Pain Loc      Pain Edu?      Excl. in [place]?    No data found.  Updated Vital Signs BP (!) 157/95 (BP Location: Left Arm)   Pulse 77   Temp 97.8 F (36.6 C) (Oral)   SpO2 100%   Visual Acuity Right Eye Distance:   Left Eye Distance:   Bilateral Distance:    Right Eye Near:   Left Eye Near:    Bilateral Near:     Physical Exam Vitals and nursing note reviewed.  Constitutional:      General: She is not in acute distress.    Appearance: Normal appearance. She is normal weight. She is not ill-appearing.  HENT:     Head: Normocephalic and atraumatic.  Cardiovascular:     Rate and Rhythm: Normal rate and regular rhythm.     Pulses: Normal pulses.     Heart sounds: Normal heart sounds. No murmur heard.   No gallop.  Pulmonary:     Effort: Pulmonary effort is normal.     Breath sounds: Normal breath sounds. No wheezing, rhonchi or rales.  Musculoskeletal:        General: No swelling, tenderness or deformity. Normal range of motion.  Skin:    General: Skin is warm and dry.     Capillary Refill: Capillary refill takes less than 2 seconds.     Findings: No erythema or rash.  Neurological:     General: No focal deficit present.     Mental Status: She is alert and oriented to person, place, and time.     Sensory: No sensory deficit.  Psychiatric:        Mood and Affect: Mood normal.        Behavior: Behavior normal.        Thought Content: Thought content normal.        Judgment: Judgment normal.      UC Treatments /  Results  Labs (all labs ordered are listed, but only abnormal results are displayed) Labs Reviewed  URINALYSIS, COMPLETE (UACMP) WITH MICROSCOPIC - Abnormal; Notable for the following components:      Result Value   APPearance HAZY (*)    Specific Gravity, Urine >1.030 (*)    Bilirubin Urine SMALL (*)    Protein, ur TRACE (*)    Bacteria, UA FEW (*)    All other components within normal limits    EKG   Radiology DG Lumbar Spine Complete  Result Date: 11/24/2020 CLINICAL DATA:  Back pain EXAM: LUMBAR SPINE - COMPLETE 4+ VIEW COMPARISON:  None. FINDINGS: Lumbar alignment within normal limits. Vertebral body heights are maintained. The disc spaces are patent. Minimal degenerative osteophytes. Aortic atherosclerosis. IMPRESSION: Mild degenerative changes.  No acute osseous abnormality Electronically Signed   By: Donavan Foil M.D.   On: 11/24/2020 18:15    Procedures Procedures (including critical care time)  Medications Ordered in UC Medications - No data to display  Initial Impression / Assessment and Plan / UC Course  I have reviewed the triage vital signs and the nursing notes.  Pertinent labs & imaging results that were available during my care of the patient were reviewed by me and considered in my medical decision making (see chart for details).  Patient is a nontoxic appearing 60 year old female here for evaluation of urinary and musculoskeletal complaints as outlined in HPI above.  Patient reports that she was started on Lasix in September after being diagnosed with right pleural effusion and moderate pericardial effusion as evidenced on CT scan.  Since then she has been experiencing urinary frequency.  She denies any urgency or dysuria.  No hematuria or cloudiness.  She states that her urine does have a different odor and is a darker yellow.  The low back pain has been present for last 2 to 3 weeks and is not associate with any injury or heavy lifting.   She states that is not affected by movement.  She is describing as a deep tooth ache that is internal.  On physical exam patient has a normal axial carriage and no midline spinal tenderness.  There is no paraspinous tenderness or spasm appreciated on exam.  Patient has full flexion and extension without any increase in pain.  Cardiopulmonary exam reveals clear lung sounds in all fields.  Urinalysis was collected at triage and is pending.  The source of patient's pain is unclear but I am concerned that it may be nerve related.  We will obtain lumbar spine films to look for degenerative changes that may be leading to nerve compression.  Patient's most recent CMP is from 11/19/2020 which showed a BUN of 21 and a creatinine of 0.70.  Electrolytes were unremarkable.  UA shows hazy appearance with high specific gravity greater than 1.030.  Small bilirubin, trace protein, and few bacteria.  6-10 squamous epithelials.  No signs of infection.  Lumbar spine films independently reviewed and evaluated by me.  There is a moderate decrease in lumbar lordosis and presence of anterior osteophytes on L3 and L4.  Joint spaces appear well-maintained.  No other bony abnormality appreciated.  Radiology overread is pending. Radiology impression is lumbar alignments are within normal limits, vertebral body heights are maintained, the disc spaces are patent, minimal degenerative osteophytes.  I suspect patient's low back pain is coming from mild lumbar degeneration.  She has done a trial of NSAIDs without any improvement in pain.  We will do a trial  of a short course of prednisone for the patient to start tomorrow.   Final Clinical Impressions(s) / UC Diagnoses   Final diagnoses:  Acute bilateral low back pain without sciatica     Discharge Instructions      Your urine did not show the presence of any infection.  Your lumbar spine x-rays showed the presence of degeneration and bone spurring in her lumbar spine.  This  may be the cause of your low back pain.  You have tried nonsteroidal anti-inflammatories without any relief of pain.  Start the prednisone tomorrow morning with breakfast and take it each day at breakfast for period of 6 days.  You may continue to use ice on your low back or you may also use moist heat for additional pain relief.  If your symptoms do not improve I recommend following up with orthopedics     ED Prescriptions     Medication Sig Dispense Auth. Provider   predniSONE (STERAPRED UNI-PAK 21 TAB) 10 MG (21) TBPK tablet Take 6 tablets on day 1, 5 tablets day 2, 4 tablets day 3, 3 tablets day 4, 2 tablets day 5, 1 tablet day 6 21 tablet Margarette Canada, NP      PDMP not reviewed this encounter.   Margarette Canada, NP 11/24/20 1826

## 2020-11-24 NOTE — Discharge Instructions (Addendum)
Your urine did not show the presence of any infection.  Your lumbar spine x-rays showed the presence of degeneration and bone spurring in her lumbar spine.  This may be the cause of your low back pain.  You have tried nonsteroidal anti-inflammatories without any relief of pain.  Start the prednisone tomorrow morning with breakfast and take it each day at breakfast for period of 6 days.  You may continue to use ice on your low back or you may also use moist heat for additional pain relief.  If your symptoms do not improve I recommend following up with orthopedics

## 2020-11-24 NOTE — ED Triage Notes (Signed)
Pt c/o of UTI symptoms lower back pain and frequency x 1 week.

## 2020-11-28 ENCOUNTER — Emergency Department: Payer: BC Managed Care – PPO

## 2020-11-28 ENCOUNTER — Other Ambulatory Visit: Payer: Self-pay

## 2020-11-28 ENCOUNTER — Encounter: Payer: Self-pay | Admitting: Radiology

## 2020-11-28 DIAGNOSIS — J101 Influenza due to other identified influenza virus with other respiratory manifestations: Secondary | ICD-10-CM | POA: Diagnosis present

## 2020-11-28 DIAGNOSIS — I700 Atherosclerosis of aorta: Secondary | ICD-10-CM | POA: Diagnosis present

## 2020-11-28 DIAGNOSIS — M545 Low back pain, unspecified: Secondary | ICD-10-CM | POA: Diagnosis not present

## 2020-11-28 DIAGNOSIS — M329 Systemic lupus erythematosus, unspecified: Secondary | ICD-10-CM | POA: Diagnosis present

## 2020-11-28 DIAGNOSIS — I110 Hypertensive heart disease with heart failure: Principal | ICD-10-CM | POA: Diagnosis present

## 2020-11-28 DIAGNOSIS — I739 Peripheral vascular disease, unspecified: Secondary | ICD-10-CM | POA: Diagnosis present

## 2020-11-28 DIAGNOSIS — K219 Gastro-esophageal reflux disease without esophagitis: Secondary | ICD-10-CM | POA: Diagnosis present

## 2020-11-28 DIAGNOSIS — Z79899 Other long term (current) drug therapy: Secondary | ICD-10-CM

## 2020-11-28 DIAGNOSIS — D869 Sarcoidosis, unspecified: Secondary | ICD-10-CM | POA: Diagnosis present

## 2020-11-28 DIAGNOSIS — K760 Fatty (change of) liver, not elsewhere classified: Secondary | ICD-10-CM | POA: Diagnosis present

## 2020-11-28 DIAGNOSIS — J209 Acute bronchitis, unspecified: Secondary | ICD-10-CM | POA: Diagnosis present

## 2020-11-28 DIAGNOSIS — F17210 Nicotine dependence, cigarettes, uncomplicated: Secondary | ICD-10-CM | POA: Diagnosis present

## 2020-11-28 DIAGNOSIS — Z716 Tobacco abuse counseling: Secondary | ICD-10-CM

## 2020-11-28 DIAGNOSIS — Z807 Family history of other malignant neoplasms of lymphoid, hematopoietic and related tissues: Secondary | ICD-10-CM

## 2020-11-28 DIAGNOSIS — I3139 Other pericardial effusion (noninflammatory): Secondary | ICD-10-CM | POA: Diagnosis present

## 2020-11-28 DIAGNOSIS — I5033 Acute on chronic diastolic (congestive) heart failure: Secondary | ICD-10-CM | POA: Diagnosis present

## 2020-11-28 DIAGNOSIS — Z20822 Contact with and (suspected) exposure to covid-19: Secondary | ICD-10-CM | POA: Diagnosis present

## 2020-11-28 DIAGNOSIS — J440 Chronic obstructive pulmonary disease with acute lower respiratory infection: Secondary | ICD-10-CM | POA: Diagnosis present

## 2020-11-28 LAB — BASIC METABOLIC PANEL
Anion gap: 7 (ref 5–15)
BUN: 21 mg/dL — ABNORMAL HIGH (ref 6–20)
CO2: 26 mmol/L (ref 22–32)
Calcium: 8.6 mg/dL — ABNORMAL LOW (ref 8.9–10.3)
Chloride: 104 mmol/L (ref 98–111)
Creatinine, Ser: 0.57 mg/dL (ref 0.44–1.00)
GFR, Estimated: >60 mL/min (ref >60)
Glucose, Bld: 133 mg/dL — ABNORMAL HIGH (ref 70–99)
Potassium: 3.9 mmol/L (ref 3.5–5.1)
Sodium: 137 mmol/L (ref 135–145)

## 2020-11-28 LAB — CBC
HCT: 48.1 % — ABNORMAL HIGH (ref 36.0–46.0)
Hemoglobin: 15.9 g/dL — ABNORMAL HIGH (ref 12.0–15.0)
MCH: 28 pg (ref 26.0–34.0)
MCHC: 33.1 g/dL (ref 30.0–36.0)
MCV: 84.7 fL (ref 80.0–100.0)
Platelets: 380 K/uL (ref 150–400)
RBC: 5.68 MIL/uL — ABNORMAL HIGH (ref 3.87–5.11)
RDW: 15.1 % (ref 11.5–15.5)
WBC: 9.1 K/uL (ref 4.0–10.5)
nRBC: 0 % (ref 0.0–0.2)

## 2020-11-28 LAB — TROPONIN I (HIGH SENSITIVITY)
Troponin I (High Sensitivity): 6 ng/L (ref <18)
Troponin I (High Sensitivity): 5 ng/L (ref <18)

## 2020-11-28 LAB — BRAIN NATRIURETIC PEPTIDE: B Natriuretic Peptide: 43.6 pg/mL (ref 0.0–100.0)

## 2020-11-28 NOTE — ED Triage Notes (Signed)
Pt c/o progressive SOB that started last Wednesday. Pt c/o swollen ABD and feels like she is overloaded with fluid. Pt has had this happen before. Pt endorses nausea.

## 2020-11-28 NOTE — ED Notes (Signed)
Pt placed on 2L O2 via [place] at this time for comfort.

## 2020-11-28 NOTE — ED Notes (Signed)
Rainbow sent to the lab.  

## 2020-11-29 ENCOUNTER — Inpatient Hospital Stay
Admission: EM | Admit: 2020-11-29 | Discharge: 2020-12-02 | DRG: 291 | Disposition: A | Discharge status: Home / Self Care | Payer: BC Managed Care – PPO | Attending: Internal Medicine | Admitting: Internal Medicine

## 2020-11-29 ENCOUNTER — Encounter: Payer: Self-pay | Admitting: Internal Medicine

## 2020-11-29 ENCOUNTER — Inpatient Hospital Stay (HOSPITAL_COMMUNITY)
Admit: 2020-11-29 | Discharge: 2020-11-29 | Disposition: A | Discharge status: Home / Self Care | Payer: BC Managed Care – PPO | Attending: Emergency Medicine | Admitting: Emergency Medicine

## 2020-11-29 ENCOUNTER — Emergency Department: Payer: BC Managed Care – PPO

## 2020-11-29 DIAGNOSIS — R109 Unspecified abdominal pain: Secondary | ICD-10-CM

## 2020-11-29 DIAGNOSIS — K219 Gastro-esophageal reflux disease without esophagitis: Secondary | ICD-10-CM | POA: Diagnosis present

## 2020-11-29 DIAGNOSIS — I700 Atherosclerosis of aorta: Secondary | ICD-10-CM | POA: Diagnosis present

## 2020-11-29 DIAGNOSIS — Z716 Tobacco abuse counseling: Secondary | ICD-10-CM | POA: Diagnosis not present

## 2020-11-29 DIAGNOSIS — Z807 Family history of other malignant neoplasms of lymphoid, hematopoietic and related tissues: Secondary | ICD-10-CM | POA: Diagnosis not present

## 2020-11-29 DIAGNOSIS — I739 Peripheral vascular disease, unspecified: Secondary | ICD-10-CM | POA: Diagnosis present

## 2020-11-29 DIAGNOSIS — M545 Low back pain, unspecified: Secondary | ICD-10-CM | POA: Diagnosis present

## 2020-11-29 DIAGNOSIS — J101 Influenza due to other identified influenza virus with other respiratory manifestations: Secondary | ICD-10-CM | POA: Diagnosis present

## 2020-11-29 DIAGNOSIS — Z9889 Other specified postprocedural states: Secondary | ICD-10-CM | POA: Diagnosis not present

## 2020-11-29 DIAGNOSIS — I3139 Other pericardial effusion (noninflammatory): Secondary | ICD-10-CM

## 2020-11-29 DIAGNOSIS — R601 Generalized edema: Secondary | ICD-10-CM | POA: Diagnosis present

## 2020-11-29 DIAGNOSIS — Z720 Tobacco use: Secondary | ICD-10-CM | POA: Diagnosis not present

## 2020-11-29 DIAGNOSIS — J209 Acute bronchitis, unspecified: Secondary | ICD-10-CM | POA: Diagnosis present

## 2020-11-29 DIAGNOSIS — J90 Pleural effusion, not elsewhere classified: Secondary | ICD-10-CM

## 2020-11-29 DIAGNOSIS — J440 Chronic obstructive pulmonary disease with acute lower respiratory infection: Secondary | ICD-10-CM | POA: Diagnosis present

## 2020-11-29 DIAGNOSIS — M329 Systemic lupus erythematosus, unspecified: Secondary | ICD-10-CM | POA: Diagnosis present

## 2020-11-29 DIAGNOSIS — D869 Sarcoidosis, unspecified: Secondary | ICD-10-CM | POA: Diagnosis present

## 2020-11-29 DIAGNOSIS — Z20822 Contact with and (suspected) exposure to covid-19: Secondary | ICD-10-CM | POA: Diagnosis present

## 2020-11-29 DIAGNOSIS — R0600 Dyspnea, unspecified: Secondary | ICD-10-CM

## 2020-11-29 DIAGNOSIS — I5033 Acute on chronic diastolic (congestive) heart failure: Secondary | ICD-10-CM | POA: Diagnosis present

## 2020-11-29 DIAGNOSIS — I110 Hypertensive heart disease with heart failure: Secondary | ICD-10-CM | POA: Diagnosis present

## 2020-11-29 DIAGNOSIS — F17210 Nicotine dependence, cigarettes, uncomplicated: Secondary | ICD-10-CM | POA: Diagnosis present

## 2020-11-29 DIAGNOSIS — K760 Fatty (change of) liver, not elsewhere classified: Secondary | ICD-10-CM | POA: Diagnosis present

## 2020-11-29 DIAGNOSIS — Z79899 Other long term (current) drug therapy: Secondary | ICD-10-CM | POA: Diagnosis not present

## 2020-11-29 LAB — HEPATIC FUNCTION PANEL
ALT: 18 U/L (ref 0–44)
AST: 25 U/L (ref 15–41)
Albumin: 3.7 g/dL (ref 3.5–5.0)
Alkaline Phosphatase: 72 U/L (ref 38–126)
Bilirubin, Direct: <0.1 mg/dL (ref 0.0–0.2)
Total Bilirubin: 0.6 mg/dL (ref 0.3–1.2)
Total Protein: 6.8 g/dL (ref 6.5–8.1)

## 2020-11-29 LAB — BRAIN NATRIURETIC PEPTIDE: B Natriuretic Peptide: 57.5 pg/mL (ref 0.0–100.0)

## 2020-11-29 LAB — ECHOCARDIOGRAM COMPLETE
AR max vel: 2.6 cm2
AV Area VTI: 3.03 cm2
AV Area mean vel: 2.74 cm2
AV Mean grad: 4 mmHg
AV Peak grad: 6.8 mmHg
Ao pk vel: 1.3 m/s
Area-P 1/2: 6.96 cm2
MV VTI: 3.44 cm2
S' Lateral: 3.19 cm

## 2020-11-29 LAB — RESP PANEL BY RT-PCR (FLU A&B, COVID) ARPGX2
Influenza A by PCR: POSITIVE — AB
Influenza B by PCR: NEGATIVE
SARS Coronavirus 2 by RT PCR: NEGATIVE

## 2020-11-29 LAB — TROPONIN I (HIGH SENSITIVITY): Troponin I (High Sensitivity): 5 ng/L (ref <18)

## 2020-11-29 MED ORDER — AZITHROMYCIN 500 MG PO TABS
500.0000 mg | ORAL_TABLET | Freq: Every day | ORAL | Status: AC
  Administered 2020-11-29: 11:00:00 500 mg via ORAL
  Filled 2020-11-29: qty 2

## 2020-11-29 MED ORDER — OXYCODONE-ACETAMINOPHEN 5-325 MG PO TABS
1.0000 | ORAL_TABLET | Freq: Once | ORAL | Status: AC
  Administered 2020-11-29: 09:00:00 1 via ORAL
  Filled 2020-11-29: qty 1

## 2020-11-29 MED ORDER — OXYCODONE-ACETAMINOPHEN 5-325 MG PO TABS
1.0000 | ORAL_TABLET | Freq: Three times a day (TID) | ORAL | Status: AC | PRN
  Administered 2020-11-29 – 2020-11-30 (×2): 1 via ORAL
  Filled 2020-11-29 (×2): qty 1

## 2020-11-29 MED ORDER — PANTOPRAZOLE SODIUM 40 MG PO TBEC
40.0000 mg | DELAYED_RELEASE_TABLET | Freq: Every day | ORAL | Status: DC
  Administered 2020-11-29 – 2020-12-02 (×4): 40 mg via ORAL
  Filled 2020-11-29 (×4): qty 1

## 2020-11-29 MED ORDER — ENOXAPARIN SODIUM 40 MG/0.4ML IJ SOSY
40.0000 mg | PREFILLED_SYRINGE | INTRAMUSCULAR | Status: DC
  Administered 2020-11-29 – 2020-12-01 (×3): 40 mg via SUBCUTANEOUS
  Filled 2020-11-29 (×3): qty 0.4

## 2020-11-29 MED ORDER — FUROSEMIDE 10 MG/ML IJ SOLN
40.0000 mg | Freq: Two times a day (BID) | INTRAMUSCULAR | Status: DC
  Administered 2020-11-29: 17:00:00 40 mg via INTRAVENOUS
  Filled 2020-11-29: qty 4

## 2020-11-29 MED ORDER — NICOTINE 14 MG/24HR TD PT24
14.0000 mg | MEDICATED_PATCH | Freq: Every day | TRANSDERMAL | Status: DC
  Administered 2020-11-29 – 2020-12-02 (×4): 14 mg via TRANSDERMAL
  Filled 2020-11-29 (×4): qty 1

## 2020-11-29 MED ORDER — MORPHINE SULFATE (PF) 2 MG/ML IV SOLN
2.0000 mg | INTRAVENOUS | Status: AC | PRN
  Administered 2020-12-01 (×2): 2 mg via INTRAVENOUS
  Filled 2020-11-29 (×2): qty 1

## 2020-11-29 MED ORDER — ONDANSETRON HCL 4 MG/2ML IJ SOLN
4.0000 mg | INTRAMUSCULAR | Status: AC
  Administered 2020-11-29: 06:00:00 4 mg via INTRAVENOUS
  Filled 2020-11-29: qty 2

## 2020-11-29 MED ORDER — IOHEXOL 300 MG/ML  SOLN
100.0000 mL | Freq: Once | INTRAMUSCULAR | Status: AC | PRN
  Administered 2020-11-29: 07:00:00 100 mL via INTRAVENOUS

## 2020-11-29 MED ORDER — SODIUM CHLORIDE 0.9 % IV SOLN: 250.0000 mL | INTRAVENOUS | Status: DC | PRN

## 2020-11-29 MED ORDER — MORPHINE SULFATE (PF) 4 MG/ML IV SOLN
4.0000 mg | Freq: Once | INTRAVENOUS | Status: AC
  Administered 2020-11-29: 06:00:00 4 mg via INTRAVENOUS
  Filled 2020-11-29: qty 1

## 2020-11-29 MED ORDER — FUROSEMIDE 40 MG PO TABS
40.0000 mg | ORAL_TABLET | Freq: Every day | ORAL | Status: DC
  Administered 2020-11-30 – 2020-12-01 (×2): 40 mg via ORAL
  Filled 2020-11-29 (×2): qty 1

## 2020-11-29 MED ORDER — AZITHROMYCIN 250 MG PO TABS
250.0000 mg | ORAL_TABLET | Freq: Every day | ORAL | Status: DC
  Administered 2020-11-30 – 2020-12-02 (×3): 250 mg via ORAL
  Filled 2020-11-29 (×3): qty 1

## 2020-11-29 MED ORDER — ONDANSETRON HCL 4 MG PO TABS: 4.0000 mg | ORAL_TABLET | Freq: Four times a day (QID) | ORAL | Status: DC | PRN

## 2020-11-29 MED ORDER — ACETAMINOPHEN 325 MG PO TABS
650.0000 mg | ORAL_TABLET | Freq: Four times a day (QID) | ORAL | Status: DC | PRN
  Administered 2020-11-29 – 2020-12-02 (×2): 650 mg via ORAL
  Filled 2020-11-29 (×2): qty 2

## 2020-11-29 MED ORDER — SODIUM CHLORIDE 0.9% FLUSH: 3.0000 mL | INTRAVENOUS | Status: DC | PRN

## 2020-11-29 MED ORDER — IPRATROPIUM-ALBUTEROL 0.5-2.5 (3) MG/3ML IN SOLN: 3.0000 mL | Freq: Four times a day (QID) | RESPIRATORY_TRACT | Status: DC | PRN

## 2020-11-29 MED ORDER — OSELTAMIVIR PHOSPHATE 75 MG PO CAPS
75.0000 mg | ORAL_CAPSULE | Freq: Two times a day (BID) | ORAL | Status: DC
  Administered 2020-11-29 – 2020-12-02 (×7): 75 mg via ORAL
  Filled 2020-11-29 (×7): qty 1

## 2020-11-29 MED ORDER — FUROSEMIDE 10 MG/ML IJ SOLN
40.0000 mg | Freq: Once | INTRAMUSCULAR | Status: AC
  Administered 2020-11-29: 09:00:00 40 mg via INTRAVENOUS
  Filled 2020-11-29: qty 4

## 2020-11-29 MED ORDER — MOMETASONE FURO-FORMOTEROL FUM 100-5 MCG/ACT IN AERO
2.0000 | INHALATION_SPRAY | Freq: Two times a day (BID) | RESPIRATORY_TRACT | Status: DC
  Administered 2020-11-29 – 2020-12-02 (×6): 2 via RESPIRATORY_TRACT
  Filled 2020-11-29: qty 8.8

## 2020-11-29 MED ORDER — SODIUM CHLORIDE 0.9% FLUSH
3.0000 mL | Freq: Two times a day (BID) | INTRAVENOUS | Status: DC
  Administered 2020-11-29 – 2020-12-02 (×7): 3 mL via INTRAVENOUS

## 2020-11-29 MED ORDER — ONDANSETRON HCL 4 MG/2ML IJ SOLN: 4.0000 mg | Freq: Four times a day (QID) | INTRAMUSCULAR | Status: DC | PRN

## 2020-11-29 MED ORDER — ACETAMINOPHEN 650 MG RE SUPP: 650.0000 mg | Freq: Four times a day (QID) | RECTAL | Status: DC | PRN

## 2020-11-29 NOTE — ED Notes (Signed)
Pt taken to CT at this time.

## 2020-11-29 NOTE — ED Notes (Signed)
Patient sleeping at this time. Respirations even and unlabored. NAD noted. Lunch tray at bedside.

## 2020-11-29 NOTE — ED Notes (Signed)
Patient given ice pack for lower back pain at this time.

## 2020-11-29 NOTE — Progress Notes (Signed)
*  PRELIMINARY RESULTS* Echocardiogram 2D Echocardiogram has been performed.  [name] 11/29/2020, 10:26 AM

## 2020-11-29 NOTE — ED Notes (Signed)
Pt stated dizziness has passed. This RN assisted with ambulation to toilet and back.  No dizziness reported and no purewick placed at this time.

## 2020-11-29 NOTE — Plan of Care (Signed)

## 2020-11-29 NOTE — ED Notes (Signed)
Patient assisted to restroom and into recliner. Call light within reach. No needs expressed at this time.

## 2020-11-29 NOTE — ED Notes (Signed)
Dr. Tobie Poet notified of the patients minimal improvement in lower back pain with the current regimen she is on - [facility] for updated meds and intervention.

## 2020-11-29 NOTE — H&P (Signed)
History and Physical    Peggy Pacheco OZD:[id number] DOB: 1960/09/30 DOA: 11/29/2020  PCP: [facility]   Patient coming from: Home  I have personally briefly reviewed patient's old medical records in [place]  Chief Complaint: Shortness of breath  HPI: Peggy Pacheco is a 60 y.o. female with medical history significant for aortic atherosclerosis, hepatic steatosis, history of alcohol abuse (quit 09/22) and nicotine dependence who presents to the ER for evaluation of shortness of breath both with exertion and at rest associated with feeling of tightness around her abdomen.  She also complains of generalized body swelling. Patient was recently hospitalized in September 2022 for anasarca thought to be related to alcoholic liver disease.  She was discharged home on a diuretic therapy which she said she has taken as prescribed but does not have a scale and still has not weighed herself as recommended.  She denies any dietary indiscretion. Patient states that she doubled up on her diuretic therapy without any significant improvement in her symptoms. She has a cough productive of yellow phlegm but denies having any fever or chills.  She denies having any headache, no myalgias, no nausea, no vomiting, no changes in her bowel habits, no urinary symptoms, no headache, no blurred vision no focal deficit. Sodium 137, potassium 3.9, chloride 104, bicarb 26, glucose 133, BUN 21, creatinine 0.57, calcium 8.6, BNP 43.6, white count 9.1, hemoglobin 15.9, hematocrit 48.1, MCV 84.7, RDW 15.1, platelet count 380 Respiratory viral panel is positive for influenza A Chest x-ray reviewed by me shows no acute cardiopulmonary disease.  Small right pleural effusion with minor associated dependent lung base atelectasis. CT scan of chest/abdomen/pelvis shows volume overload with anasarca, right pleural effusion and pericardial effusion.  Pericardial effusion measures up to 2.5 cm in  thickness. Twelve-lead EKG reviewed by me shows normal sinus rhythm with low voltage QRS  ED Course: Patient is a 60 year old female who presents to the emergency room for evaluation of worsening shortness of breath and generalized body swelling. CT scan of the chest shows pericardial effusion measuring up to 2.5 cm in thickness Compared to a CT angiogram which was done in September, 2022 which showed a pericardial effusion measuring up to 1.2 cm in thickness. Her Influenza PCR test is positive. Patient will be admitted to the hospital for further evaluation.   Review of Systems: As per HPI otherwise all other systems reviewed and negative.    Past Medical History:  Diagnosis Date   Alcohol use    a. Quit 09/2020.   Diastolic dysfunction    GERD (gastroesophageal reflux disease)    Pericardial effusion    a.09/2020 Echo: EF 60-65%, no rwma, GrI DD, Nl RV fxn. Small-mod circumferential pericardial effusion. Triv TR. Mild-mod AoV sclerosis; b. 10/2020 Echo: EF 60-65%, no rwma. Mod circumferential pericardial effusion.   Tobacco abuse    a. Cutting back - 1/3-1/2 ppd.    Past Surgical History:  Procedure Laterality Date   NO PAST SURGERIES       reports that she has been smoking cigarettes. She has a 22.50 pack-year smoking history. She has never used smokeless tobacco. She reports current alcohol use of about 42.0 standard drinks per week. She reports that she does not currently use drugs.  No Known Allergies  Family History  Adopted: Yes  Family history unknown: Yes      Prior to Admission medications   Medication Sig Start Date End Date Taking? Authorizing Provider  furosemide (LASIX) 40  MG tablet Take 1 tablet (40 mg total) by mouth daily. Patient taking differently: Take 40 mg by mouth 2 (two) times daily. 11/08/20 02/06/21  End, Harrell Gave, MD  naproxen (NAPROSYN) 500 MG tablet Take 1 tablet (500 mg total) by mouth 2 (two) times daily. 07/30/19   Melynda Ripple, MD   omeprazole (PRILOSEC) 20 MG capsule Take 20 mg by mouth daily. Patient not taking: Reported on 11/19/2020    [provider]  predniSONE (STERAPRED UNI-PAK 21 TAB) 10 MG (21) TBPK tablet Take 6 tablets on day 1, 5 tablets day 2, 4 tablets day 3, 3 tablets day 4, 2 tablets day 5, 1 tablet day 6 11/24/20   Margarette Canada, NP  fluticasone [facility]) 50 MCG/ACT nasal spray Place 2 sprays into both nostrils daily. 09/16/19 05/03/20  Melynda Ripple, MD    Physical Exam: Vitals:   11/29/20 0536 11/29/20 0603 11/29/20 0753 11/29/20 0830  BP:  (!) 176/104 119/72 (!) 106/55  Pulse: (!) 111 89 83 70  Resp: (!) 30 20 18 18   Temp:      TempSrc:      SpO2: 100% 100% 95% 95%  Weight:      Height:         Vitals:   11/29/20 0536 11/29/20 0603 11/29/20 0753 11/29/20 0830  BP:  (!) 176/104 119/72 (!) 106/55  Pulse: (!) 111 89 83 70  Resp: (!) 30 20 18 18   Temp:      TempSrc:      SpO2: 100% 100% 95% 95%  Weight:      Height:          Constitutional: Alert and oriented x 3 . Not in any apparent distress. HEENT:      Head: Normocephalic and atraumatic.         Eyes: PERLA, EOMI, Conjunctivae are normal. Sclera is non-icteric.       Mouth/Throat: Mucous membranes are moist.       Neck: Supple with no signs of meningismus. Cardiovascular: Regular rate and rhythm. No murmurs, gallops, or rubs. 2+ symmetrical distal pulses are present . No JVD. 2+ LE edema Respiratory: Tachypneic.crackles right lung base.  Scattered wheezes bilaterally. No rhonchi.  Gastrointestinal: Soft, non tender, and non distended with positive bowel sounds.  Genitourinary: No CVA tenderness. Musculoskeletal: Nontender with normal range of motion in all extremities. No cyanosis, or erythema of extremities. Neurologic:  Face is symmetric. Moving all extremities. No gross focal neurologic deficits . Skin: Skin is warm, dry.  Generalized subcutaneous edema  Psychiatric: Mood and affect are normal    Labs on  Admission: I have personally reviewed following labs and imaging studies  CBC: Recent Labs  Lab 11/28/20 1941  WBC 9.1  HGB 15.9*  HCT 48.1*  MCV 84.7  PLT 025   Basic Metabolic Panel: Recent Labs  Lab 11/28/20 1941  NA 137  K 3.9  CL 104  CO2 26  GLUCOSE 133*  BUN 21*  CREATININE 0.57  CALCIUM 8.6*   GFR: Estimated Creatinine Clearance: 74.9 mL/min (by C-G formula based on SCr of 0.57 mg/dL). Liver Function Tests: No results for input(s): AST, ALT, ALKPHOS, BILITOT, PROT, ALBUMIN in the last 168 hours. No results for input(s): LIPASE, AMYLASE in the last 168 hours. No results for input(s): AMMONIA in the last 168 hours. Coagulation Profile: No results for input(s): INR, PROTIME in the last 168 hours. Cardiac Enzymes: No results for input(s): CKTOTAL, CKMB, CKMBINDEX, TROPONINI in the last 168 hours. BNP (last  3 results) No results for input(s): PROBNP in the last 8760 hours. HbA1C: No results for input(s): HGBA1C in the last 72 hours. CBG: No results for input(s): GLUCAP in the last 168 hours. Lipid Profile: No results for input(s): CHOL, HDL, LDLCALC, TRIG, CHOLHDL, LDLDIRECT in the last 72 hours. Thyroid Function Tests: No results for input(s): TSH, T4TOTAL, FREET4, T3FREE, THYROIDAB in the last 72 hours. Anemia Panel: No results for input(s): VITAMINB12, FOLATE, FERRITIN, TIBC, IRON, RETICCTPCT in the last 72 hours. Urine analysis:    Component Value Date/Time   COLORURINE YELLOW 11/24/2020 1717   APPEARANCEUR HAZY (A) 11/24/2020 1717   LABSPEC >1.030 (H) 11/24/2020 1717   PHURINE 5.5 11/24/2020 1717   GLUCOSEU NEGATIVE 11/24/2020 1717   HGBUR NEGATIVE 11/24/2020 1717   BILIRUBINUR SMALL (A) 11/24/2020 1717   KETONESUR NEGATIVE 11/24/2020 1717   PROTEINUR TRACE (A) 11/24/2020 1717   NITRITE NEGATIVE 11/24/2020 1717   LEUKOCYTESUR NEGATIVE 11/24/2020 1717    Radiological Exams on Admission: DG Chest 2 View  Result Date: 11/28/2020 CLINICAL DATA:   Pt c/o progressive SOB that started last Wednesday. Pt c/o swollen ABD and feels like she is overloaded with fluid. Pt has had this happen before. Pt endorses nausea. EXAM: CHEST - 2 VIEW COMPARISON:  09/13/2020 FINDINGS: Cardiac silhouette is normal in size and configuration. Normal mediastinal and hilar contours. Small right pleural effusion obscures the right hemidiaphragm, associated with minor dependent atelectasis. Lungs otherwise clear. No left pleural effusion. No pneumothorax. Skeletal structures are intact. IMPRESSION: 1. No acute cardiopulmonary disease. 2. Small right pleural effusion with minor associated dependent lung base atelectasis, stable compared to the prior chest radiograph. Electronically Signed   By: Lajean Manes M.D.   On: 11/28/2020 20:35   CT CHEST ABDOMEN PELVIS W CONTRAST  Result Date: 11/29/2020 CLINICAL DATA:  Acute abdominal pain. Non localized respiratory illness. EXAM: CT CHEST, ABDOMEN, AND PELVIS WITH CONTRAST TECHNIQUE: Multidetector CT imaging of the chest, abdomen and pelvis was performed following the standard protocol during bolus administration of intravenous contrast. CONTRAST:  152mL OMNIPAQUE IOHEXOL 300 MG/ML  SOLN COMPARISON:  None. FINDINGS: CT CHEST FINDINGS Cardiovascular: Normal heart size. Low-density pericardial effusion measuring up to 2.5 cm in thickness below the heart. Volume is overall moderate. Atheromatous calcification of the aorta. Mediastinum/Nodes: Negative for adenopathy or mass. Lungs/Pleura: Small to moderate right pleural effusion which is fluid density. Mild atelectasis at the right base. Musculoskeletal: Midthoracic degenerative disc space narrowing and ridging. Generalized subcutaneous edema skin thickening especially affecting the breasts. No axillary adenopathy. CT ABDOMEN PELVIS FINDINGS Hepatobiliary: No focal liver abnormality.No evidence of biliary obstruction or stone. Pancreas: Unremarkable. Spleen: Unremarkable. Adrenals/Urinary  Tract: Negative adrenals. No hydronephrosis or stone. Unremarkable bladder. Stomach/Bowel: No obstruction. No visible bowel inflammation. Colonic diverticulosis. Vascular/Lymphatic: No acute vascular abnormality. Scattered atheromatous calcifications. No mass or adenopathy. Reproductive:No pathologic findings. Other: Small volume peritoneal fluid.  Body wall edema. Musculoskeletal: No acute abnormalities. IMPRESSION: 1. Volume overload with anasarca, right pleural effusion, and pericardial effusion. Pericardial effusion measures up to 2.5 cm in thickness. 2.  Aortic Atherosclerosis (ICD10-I70.0). Electronically Signed   By: Jorje Guild M.D.   On: 11/29/2020 07:20   CT L-SPINE NO CHARGE  Result Date: 11/29/2020 CLINICAL DATA:  Progressive shortness of breath EXAM: CT Lumbar Spine with contrast TECHNIQUE: Technique: Multiplanar CT images of the lumbar spine were reconstructed from contemporary CT of the Abdomen and Pelvis. CONTRAST:  None additional COMPARISON:  Radiography five days ago FINDINGS: Segmentation: 5 lumbar type vertebrae.  Alignment: Normal. Vertebrae: No acute fracture or focal pathologic process. Paraspinal and other soft tissues: Subcutaneous edema diffusely in the back which is from anasarca by source images. Disc levels: Diffusely preserved disc height. No significant endplate or facet spurring. No visible impingement IMPRESSION: No acute finding or visible impingement. Electronically Signed   By: Jorje Guild M.D.   On: 11/29/2020 07:12     Assessment/Plan Principal Problem:   Anasarca Active Problems:   Pericardial effusion   GERD (gastroesophageal reflux disease)   Tobacco abuse   Influenza A   COPD with acute bronchitis [facility])     Patient is a 60 year old female who presents to the ER for evaluation of shortness of breath and noted to have worsening pericardial effusion    Anasarca/pericardial effusion Patient presents for evaluation of worsening shortness of breath  and generalized swelling. CT scan of the chest shows a pericardial effusion measuring up to 2.5 cm in thickness, this is increased when compared to a CT scan of the chest done in in 09/22 which showed pericardial effusion measuring up to 1.2 cm in thickness Unclear etiology for patient's anasarca, initially thought to be secondary to alcoholic liver disease Will place patient on Lasix 40 mg IV every 12 Repeat echocardiogram We will consult cardiology     Influenza A Place patient on droplet precautions Start Tamiflu 75 mg p.o. twice daily    COPD with acute bronchitis Place patient on bronchodilator therapy as well as inhaled steroids Start Z-Pak    Nicotine dependence Smoking cessation has been discussed with patient in detail Will place patient on a nicotine transdermal patch 14 mg daily   DVT prophylaxis: Lovenox  Code Status: full code  Family Communication: Greater than 50% of time was spent discussing patient's condition and plan of care with her at the bedside.  All questions and concerns have been addressed.  She verbalizes understanding and agrees with the plan. Disposition Plan: Back to previous home environment Consults called: Cardiology Status:At the time of admission, it appears that the appropriate admission status for this patient is inpatient. This is judged to be reasonable and necessary to provide the required intensity of service to ensure the patient's safety given the presenting symptoms, physical exam findings, and initial radiographic and laboratory data in the context of their comorbid conditions. Patient requires inpatient status due to high intensity of service, high risk for further deterioration and high frequency of surveillance required.    Collier Bullock MD [facility]     11/29/2020, 9:43 AM

## 2020-11-29 NOTE — ED Notes (Signed)
Patient resting comfortably in bed at this time. No needs expressed to RN.

## 2020-11-29 NOTE — ED Notes (Signed)
Pt endorses dizziness when laying still at this time.  Requested to use the bathroom, purewick placed for safety.

## 2020-11-29 NOTE — ED Notes (Signed)
Per patient, she does not want any info given to [facility], soon to be ex-son in law.

## 2020-11-29 NOTE — ED Provider Notes (Signed)
[facility] Emergency Department Provider Note  ____________________________________________   Event Date/Time   First MD Initiated Contact with Patient 11/29/20 0530     (approximate)  I have reviewed the triage vital signs and the nursing notes.   HISTORY  Chief Complaint Shortness of Breath    HPI Peggy Pacheco is a 60 y.o. female with a somewhat complicated medical history that includes prior episode of anasarca requiring admission as well as a chronic pericardial effusion measuring possibly as much is up to a liter in volume for which she is followed by cardiology.  She is on Lasix 40 mg a day ostensibly for the pericardial effusion.  She has had echocardiograms in the past that the do not show decreased ejection fraction.  The patient presents tonight for general malaise, generalized weakness, increased shortness of breath, increased occasionally productive cough, abdominal tightness, and generalized swelling.  She says she feels like she did before when she had to be admitted for anasarca.  She said that she went off of her Lasix for several days because her doctor told her to take an increased dose because of a worsening size of the pericardial effusion, and she believes that the increased dose of Lasix has caused her back to hurt very severely with sharp pain.  Because she thinks that the back pain was caused by the Lasix (she also had [place] the [place] and the pain started after the fall), she stopped taking the Lasix for several days and then her breathing and other symptoms have gotten worse.  Nothing in particular makes the symptoms better and exertion makes all of her symptoms worse.  She reports that the pain is severe as is her shortness of breath and the feeling of tightness throughout her body.  She denies fever, sore throat, nausea, vomiting, and abdominal pain other than the feeling of having a tense abdomen.     Past Medical  History:  Diagnosis Date   Alcohol use    a. Quit 09/2020.   Diastolic dysfunction    GERD (gastroesophageal reflux disease)    Pericardial effusion    a.09/2020 Echo: EF 60-65%, no rwma, GrI DD, Nl RV fxn. Small-mod circumferential pericardial effusion. Triv TR. Mild-mod AoV sclerosis; b. 10/2020 Echo: EF 60-65%, no rwma. Mod circumferential pericardial effusion.   Tobacco abuse    a. Cutting back - 1/3-1/2 ppd.    Patient Active Problem List   Diagnosis Date Noted   Peripheral edema    Pericardial effusion 09/13/2020   Pleural effusion 09/13/2020   Bilateral leg edema 09/13/2020   Numbness in both arms and submandibular area 09/13/2020   Tobacco abuse 09/13/2020   Alcohol use 09/13/2020   SOB (shortness of breath) 09/13/2020   Hypertension 09/13/2020   GERD (gastroesophageal reflux disease)     Past Surgical History:  Procedure Laterality Date   NO PAST SURGERIES      Prior to Admission medications   Medication Sig Start Date End Date Taking? Authorizing Provider  furosemide (LASIX) 40 MG tablet Take 1 tablet (40 mg total) by mouth daily. Patient taking differently: Take 40 mg by mouth 2 (two) times daily. 11/08/20 02/06/21  End, Harrell Gave, MD  naproxen (NAPROSYN) 500 MG tablet Take 1 tablet (500 mg total) by mouth 2 (two) times daily. 07/30/19   Melynda Ripple, MD  omeprazole (PRILOSEC) 20 MG capsule Take 20 mg by mouth daily. Patient not taking: Reported on 11/19/2020    [provider]  predniSONE (STERAPRED UNI-PAK 21 TAB) 10 MG (21) TBPK tablet Take 6 tablets on day 1, 5 tablets day 2, 4 tablets day 3, 3 tablets day 4, 2 tablets day 5, 1 tablet day 6 11/24/20   Margarette Canada, NP  fluticasone [facility]) 50 MCG/ACT nasal spray Place 2 sprays into both nostrils daily. 09/16/19 05/03/20  Melynda Ripple, MD    Allergies Patient has no known allergies.  Family History  Adopted: Yes  Family history unknown: Yes    Social History Social History   Tobacco Use    Smoking status: Every Day    Packs/day: 0.50    Years: 45.00    Pack years: 22.50    Types: Cigarettes   Smokeless tobacco: Never  Vaping Use   Vaping Use: Never used  Substance Use Topics   Alcohol use: Yes    Alcohol/week: 42.0 standard drinks    Types: 42 Cans of beer per week    Comment: 6 beers per night   Drug use: Not Currently    Review of Systems Constitutional: Positive for general malaise and weakness.  No fever/chills Eyes: No visual changes. ENT: No sore throat. Cardiovascular: Denies chest pain. Respiratory: Positive for shortness of breath Gastrointestinal: Reports that her abdomen feels tight and swollen,  but no abdominal pain.  No nausea, no vomiting.  No diarrhea.  No constipation. Genitourinary: Negative for dysuria. Musculoskeletal: Negative for neck pain.  Negative for back pain. Integumentary: Negative for rash. Neurological: Negative for headaches, focal weakness or numbness.   ____________________________________________   PHYSICAL EXAM:  VITAL SIGNS: ED Triage Vitals  Enc Vitals Group     BP 11/28/20 1921 (!) 150/88     Pulse Rate 11/28/20 1921 (!) 105     Resp 11/28/20 1921 (!) 40     Temp 11/28/20 1921 98.1 F (36.7 C)     Temp Source 11/28/20 1921 Oral     SpO2 11/28/20 1921 94 %     Weight 11/28/20 1922 74.8 kg (165 lb)     Height 11/28/20 1922 1.626 m (5\' 4" )     Head Circumference --      Peak Flow --      Pain Score 11/28/20 1938 8     Pain Loc --      Pain Edu? --      Excl. in [place]? --     Constitutional: Alert and oriented.  Appears uncomfortable but not in severe distress. Eyes: Conjunctivae are normal.  Head: Atraumatic. Nose: No congestion/rhinnorhea. Mouth/Throat: Patient is wearing a mask. Neck: No stridor.  No meningeal signs.   Cardiovascular: Normal rate, regular rhythm. Good peripheral circulation. Respiratory: Normal respiratory effort.  No retractions.  However the patient has frequent thick sounding  cough. Gastrointestinal: Soft and and distended, tense with mild diffuse tenderness throughout but no localized peritonitis. Musculoskeletal: Trace pitting edema in bilateral lower extremities.. No gross deformities of extremities. Neurologic:  Normal speech and language. No gross focal neurologic deficits are appreciated.  Skin:  Skin is warm, dry and intact. Psychiatric: Mood and affect are normal. Speech and behavior are normal.  ____________________________________________   LABS (all labs ordered are listed, but only abnormal results are displayed)  Labs Reviewed  RESP PANEL BY RT-PCR (FLU A&B, COVID) ARPGX2 - Abnormal; Notable for the following components:      Result Value   Influenza A by PCR POSITIVE (*)    All other components within normal limits  CBC - Abnormal; Notable for the following components:  RBC 5.68 (*)    Hemoglobin 15.9 (*)    HCT 48.1 (*)    All other components within normal limits  BASIC METABOLIC PANEL - Abnormal; Notable for the following components:   Glucose, Bld 133 (*)    BUN 21 (*)    Calcium 8.6 (*)    All other components within normal limits  BRAIN NATRIURETIC PEPTIDE  TROPONIN I (HIGH SENSITIVITY)  TROPONIN I (HIGH SENSITIVITY)  TROPONIN I (HIGH SENSITIVITY)  TROPONIN I (HIGH SENSITIVITY)   ____________________________________________  EKG  ED ECG REPORT I, Hinda Kehr, the attending physician, personally viewed and interpreted this ECG.  Date: 11/28/2020 EKG Time: 19:32 Rate: 100 Rhythm: Borderline sinus tachycardia QRS Axis: normal Intervals: normal ST/T Wave abnormalities: normal Narrative Interpretation: no evidence of acute ischemia  ____________________________________________  RADIOLOGY I, Hinda Kehr, personally viewed and evaluated these images (plain radiographs) as part of my medical decision making, as well as reviewing the written report by the radiologist.  ED MD interpretation: No acute cardiopulmonary  disease on chest x-ray but with a small right pleural effusion.  CT of the chest/abdomen/pelvis/L-spine pending at time of transfer of care.  Official radiology report(s): DG Chest 2 View  Result Date: 11/28/2020 CLINICAL DATA:  Pt c/o progressive SOB that started last Wednesday. Pt c/o swollen ABD and feels like she is overloaded with fluid. Pt has had this happen before. Pt endorses nausea. EXAM: CHEST - 2 VIEW COMPARISON:  09/13/2020 FINDINGS: Cardiac silhouette is normal in size and configuration. Normal mediastinal and hilar contours. Small right pleural effusion obscures the right hemidiaphragm, associated with minor dependent atelectasis. Lungs otherwise clear. No left pleural effusion. No pneumothorax. Skeletal structures are intact. IMPRESSION: 1. No acute cardiopulmonary disease. 2. Small right pleural effusion with minor associated dependent lung base atelectasis, stable compared to the prior chest radiograph. Electronically Signed   By: Lajean Manes M.D.   On: 11/28/2020 20:35   ____________________________________________   PROCEDURES   Procedure(s) performed (including Critical Care):  .1-3 Lead EKG Interpretation Performed by: Hinda Kehr, MD Authorized by: Hinda Kehr, MD     Interpretation: normal     ECG rate:  88   ECG rate assessment: normal     Rhythm: sinus rhythm     Ectopy: none     Conduction: normal     ____________________________________________   INITIAL IMPRESSION / MDM / ASSESSMENT AND PLAN / ED COURSE  As part of my medical decision making, I reviewed the following data within the [place] notes reviewed and incorporated, Labs reviewed , EKG interpreted , Old chart reviewed, Patient signed out to Dr. Starleen Blue, Radiograph reviewed , and Notes from prior ED visits   Differential diagnosis includes, but is not limited to, anasarca, pulmonary edema, pericardial effusion, pericarditis, nonspecific viral infection.  The  patient is on the cardiac monitor to evaluate for evidence of arrhythmia and/or significant heart rate changes.  Vital signs generally stable other than hypertension which is seemingly uncontrolled.  No chest pain at this time.  No evidence of ischemia on EKG.  I personally reviewed the patient's imaging and agree with the radiologist's interpretation that there are no acute abnormalities on the chest x-ray.  Basic metabolic panel unremarkable, initial high-sensitivity troponin is within normal limits, CBC is within normal limits, BNP is within normal limits.  However on physical exam her presentation is consistent with anasarca, and I reviewed the medical records including her recent visit to cardiology that commented that her pericardial  effusion is getting worse.  She has also been nonadherent to her medication regimen for several days.  I am concerned that she is developing worsening anasarca and probably worsening pericardial effusion.  Given her constellation of symptoms I am obtaining a CT chest/abdomen/pelvis with lumbar spine recon given that she is reporting back pain as well.  This will help elucidate the presence or absence of anasarca as well as to further characterize the pericardial effusion given that I cannot obtain a stat echocardiogram at night.  Patient understands and agrees with the plan.  Respiratory viral swab is also pending.  For the patient's pain I ordered morphine 4 mg IV and Zofran 4 mg IV.  Transferring ED care to Dr. Starleen Blue to follow-up on the CT scans.  The patient will likely need admission for the problems listed above.          MEDICATIONS GIVEN DURING THIS VISIT:  Medications  ondansetron (ZOFRAN) injection 4 mg (4 mg Intravenous Given 11/29/20 0610)  morphine 4 MG/ML injection 4 mg (4 mg Intravenous Given 11/29/20 0613)  iohexol (OMNIPAQUE) 300 MG/ML solution 100 mL (100 mLs Intravenous Contrast Given 11/29/20 0645)     ED Discharge Orders     None         Note:  This document was prepared using Dragon voice recognition software and may include unintentional dictation errors.   Hinda Kehr, MD 11/29/20 [phone]

## 2020-11-29 NOTE — Consult Note (Signed)
Cardiology Consultation:   Patient ID: ESSANCE Pacheco MRN: [id number]; DOB: 12/10/1960  Admit date: 11/29/2020 Date of Consult: 11/29/2020  PCP:  [facility]   [facility] Providers Cardiologist:  Kathlyn Sacramento, MD   {  Patient Profile:   Peggy Pacheco is a 60 y.o. female with a hx of pericardial effusion, aortic atherosclerosis, hepatic steatosis, HTN, ETOH/tobacco abuse who is being seen 11/29/2020 for the evaluation of pericardial effusion at the request of Dr. Francine Graven.  History of Present Illness:   Ms. Peggy is followed by Dr. Fletcher Anon for the above cardiac issues. She was admitted 09/13/20 for leg swelling, anasarca, and moderate pericardial effusion. HS trop negative and BNP of 12. CTA chest was performed and revealed moderate pericardial effusion, right pleural effusion, aortic atherosclerosis and hepati steatosis. Echo showed EF 60-65%, G1DD, sm to mod circumferential pericardial effusion without tamponade, trivial MR, mild to mod AV sclerosis. Due to preserved LC function and normal BNP is was unclear if symptoms were cardiac in origin and there was question of autoimmune or infectious process. She was discharged on lasix 62m daily with follow-up PCP and GI. Since stopped all alcohol use. Repeat echo 10/18 showed stable moderate pericardial effusion and lasix was increased to 424mdaily.   Last seen 11/19/20 and was feeling well. Abdominal tightness was improved. Weight was up 6 lbs. Suspected diet noncompliance. Lasix was increased to lasix 4044mID for 3 days. Limited echo was ordered.  The patient presented to the ER 11/29/20 for shortness of breath, generalized weakness, chest tightness, and cough. Symptoms had been occurring for the last few days. NO LLE, orthopnea, pnd, palpitations. Patient reported she initially increased the lasix  for 3 days, but then had sharp chest pain and back pain, so she stopped it for 2 days. She then restarted lasix.   In the ER  BP 150/88, pulse 105, RR 40, afebrile, 94% O2. Influenza positive. Labs showed Hgb potassium 3.9, Scr 0.57, BUN 21, AST 25, ALT 18, alk phos 72. Hgb 15.9. BNP 43. HS trop 6>5. WBC 9.1.  CXR showed no acute pulmonary disease, small right pleural effusion.    Past Medical History:  Diagnosis Date   Alcohol use    a. Quit 09/2020.   Diastolic dysfunction    GERD (gastroesophageal reflux disease)    Pericardial effusion    a.09/2020 Echo: EF 60-65%, no rwma, GrI DD, Nl RV fxn. Small-mod circumferential pericardial effusion. Triv TR. Mild-mod AoV sclerosis; b. 10/2020 Echo: EF 60-65%, no rwma. Mod circumferential pericardial effusion.   Tobacco abuse    a. Cutting back - 1/3-1/2 ppd.    Past Surgical History:  Procedure Laterality Date   NO PAST SURGERIES       Home Medications:  Prior to Admission medications   Medication Sig Start Date End Date Taking? Authorizing Provider  furosemide (LASIX) 40 MG tablet Take 1 tablet (40 mg total) by mouth daily. Patient taking differently: Take 40 mg by mouth 2 (two) times daily. 11/08/20 02/06/21  End, ChrHarrell GaveD  naproxen (NAPROSYN) 500 MG tablet Take 1 tablet (500 mg total) by mouth 2 (two) times daily. 07/30/19   MorMelynda RippleD  omeprazole (PRILOSEC) 20 MG capsule Take 20 mg by mouth daily. Patient not taking: Reported on 11/19/2020    [provider]  predniSONE (STERAPRED UNI-PAK 21 TAB) 10 MG (21) TBPK tablet Take 6 tablets on day 1, 5 tablets day 2, 4 tablets day 3, 3 tablets day 4, 2  tablets day 5, 1 tablet day 6 11/24/20   Margarette Canada, NP  fluticasone [facility]) 50 MCG/ACT nasal spray Place 2 sprays into both nostrils daily. 09/16/19 05/03/20  Melynda Ripple, MD    Inpatient Medications: Scheduled Meds:  [START ON 11/30/2020] azithromycin  250 mg Oral Daily   enoxaparin (LOVENOX) injection  40 mg Subcutaneous Q24H   furosemide  40 mg Intravenous Q12H   mometasone-formoterol  2 puff Inhalation BID   nicotine  14 mg  Transdermal Daily   oseltamivir  75 mg Oral BID   pantoprazole  40 mg Oral Daily   sodium chloride flush  3 mL Intravenous Q12H   Continuous Infusions:  sodium chloride     PRN Meds: sodium chloride, acetaminophen **OR** acetaminophen, ipratropium-albuterol, ondansetron **OR** ondansetron (ZOFRAN) IV, sodium chloride flush  Allergies:   No Known Allergies  Social History:   Social History   Socioeconomic History   Marital status: Widowed    Spouse name: Not on file   Number of children: Not on file   Years of education: Not on file   Highest education level: Not on file  Occupational History   Not on file  Tobacco Use   Smoking status: Every Day    Packs/day: 0.50    Years: 45.00    Pack years: 22.50    Types: Cigarettes   Smokeless tobacco: Never  Vaping Use   Vaping Use: Never used  Substance and Sexual Activity   Alcohol use: Yes    Alcohol/week: 42.0 standard drinks    Types: 42 Cans of beer per week    Comment: 6 beers per night   Drug use: Not Currently   Sexual activity: Not on file  Other Topics Concern   Not on file  Social History Narrative   Not on file   Social Determinants of Health   Financial Resource Strain: Not on file  Food Insecurity: Not on file  Transportation Needs: Not on file  Physical Activity: Not on file  Stress: Not on file  Social Connections: Not on file  Intimate Partner Violence: Not on file    Family History:    Family History  Adopted: Yes  Family history unknown: Yes     ROS:  Please see the history of present illness.   All other ROS reviewed and negative.     Physical Exam/Data:   Vitals:   11/29/20 0603 11/29/20 0753 11/29/20 0830 11/29/20 1047  BP: (!) 176/104 119/72 (!) 106/55 109/77  Pulse: 89 83 70 93  Resp: _0 Temp:      TempSrc:      SpO2: 100% 95% 95% 97%  Weight:      Height:       No intake or output data in the 24 hours ending 11/29/20 1124 Last 3 Weights 11/28/2020 11/19/2020  10/01/2020  Weight (lbs) 165 lb 164 lb 158 lb 9.6 oz  Weight (kg) 74.844 kg 74.39 kg 71.94 kg     Body mass index is 28.32 kg/m.  General:  Well nourished, well developed, in no acute distress HEENT: normal Neck: no JVD Vascular: No carotid bruits; Distal pulses 2+ bilaterally Cardiac:  normal S1, S2; RRR; no murmur  Lungs:  wheezing Abd: soft, nontender, no hepatomegaly  Ext: no edema Musculoskeletal:  No deformities, BUE and BLE strength normal and equal Skin: warm and dry  Neuro:  CNs 2-12 intact, no focal abnormalities noted Psych:  Normal affect   EKG:  The EKG  was personally reviewed and demonstrates:  SR 100bpm, low voltage, nonspecific T wave changes Telemetry:  Telemetry was personally reviewed and demonstrates:  NSR, ST to 120s, HR 70-80s  Relevant CV Studies:  Echo 11/29/20  1. Left ventricular ejection fraction, by estimation, is 60 to 65%. The  left ventricle has normal function. The left ventricle has no regional  wall motion abnormalities. Left ventricular diastolic parameters are  consistent with Grade I diastolic  dysfunction (impaired relaxation).   2. Right ventricular systolic function is normal. The right ventricular  size is normal.   3. Moderate pericardial effusion. The pericardial effusion is  circumferential. The effusion is only slighltly larger than last echo in  September. There is no evidence of cardiac tamponade.   4. The mitral valve is normal in structure. No evidence of mitral valve  regurgitation. No evidence of mitral stenosis.   5. The aortic valve is normal in structure. Aortic valve regurgitation is  not visualized. Aortic valve sclerosis/calcification is present, without  any evidence of aortic stenosis.   6. The inferior vena cava is normal in size with greater than 50%  respiratory variability, suggesting right atrial pressure of 3 mmHg.   Echo limited 11/19/20  1. Left ventricular ejection fraction, by estimation, is 60 to 65%. The   left ventricle has normal function. The left ventricle has no regional  wall motion abnormalities.   2. Right ventricular systolic function is normal. The right ventricular  size is normal.   3. The mitral valve is normal in structure. No evidence of mitral valve  regurgitation. No evidence of mitral stenosis. Moderate mitral annular  calcification.   4. The aortic valve is normal in structure. Aortic valve regurgitation is  not visualized. No aortic stenosis is present.   5. The inferior vena cava is dilated in size with >50% respiratory  variability, suggesting right atrial pressure of 8 mmHg.   6. Moderate pericardial effusion, circumferential, estimated 1.13 to 1.3  cm, no tamponade noted. IVC not well visualized. Grossly, no significant  RA and RV diastolic collapse Per echo tech, IVC measures 2 cm and grossly  little respiratory variation  (data not available).   Echo 09/14/20  1. Left ventricular ejection fraction, by estimation, is 60 to 65%. The  left ventricle has normal function. The left ventricle has no regional  wall motion abnormalities. Left ventricular diastolic parameters are  consistent with Grade I diastolic  dysfunction (impaired relaxation).   2. Right ventricular systolic function is normal. The right ventricular  size is normal. Mildly increased right ventricular wall thickness.   3. There is a small-moderate size pericardial effusion. The pericardial  effusion is circumferential. There is no evidence of cardiac tamponade.   4. The mitral valve is grossly normal. Trivial mitral valve  regurgitation.   5. The aortic valve has an indeterminant number of cusps. There is mild  calcification of the aortic valve. There is mild thickening of the aortic  valve. Aortic valve regurgitation is not visualized. Mild to moderate  aortic valve sclerosis/calcification  is present, without any evidence of aortic stenosis.   6. The inferior vena cava is normal in size with  greater than 50%  respiratory variability, suggesting right atrial pressure of 3 mmHg.    Laboratory Data:  High Sensitivity Troponin:   Recent Labs  Lab 11/28/20 1941 11/28/20 2234  TROPONINIHS 6 5     Chemistry Recent Labs  Lab 11/28/20 1941  NA 137  K 3.9  CL 104  CO2 26  GLUCOSE 133*  BUN 21*  CREATININE 0.57  CALCIUM 8.6*  GFRNONAA >60  ANIONGAP 7    Recent Labs  Lab 11/28/20 2234  PROT 6.8  ALBUMIN 3.7  AST 25  ALT 18  ALKPHOS 72  BILITOT 0.6   Lipids No results for input(s): CHOL, TRIG, HDL, LABVLDL, LDLCALC, CHOLHDL in the last 168 hours.  Hematology Recent Labs  Lab 11/28/20 1941  WBC 9.1  RBC 5.68*  HGB 15.9*  HCT 48.1*  MCV 84.7  MCH 28.0  MCHC 33.1  RDW 15.1  PLT 380   Thyroid No results for input(s): TSH, FREET4 in the last 168 hours.  BNP Recent Labs  Lab 11/28/20 1941  BNP 43.6    DDimer No results for input(s): DDIMER in the last 168 hours.   Radiology/Studies:  DG Chest 2 View  Result Date: 11/28/2020 CLINICAL DATA:  Pt c/o progressive SOB that started last Wednesday. Pt c/o swollen ABD and feels like she is overloaded with fluid. Pt has had this happen before. Pt endorses nausea. EXAM: CHEST - 2 VIEW COMPARISON:  09/13/2020 FINDINGS: Cardiac silhouette is normal in size and configuration. Normal mediastinal and hilar contours. Small right pleural effusion obscures the right hemidiaphragm, associated with minor dependent atelectasis. Lungs otherwise clear. No left pleural effusion. No pneumothorax. Skeletal structures are intact. IMPRESSION: 1. No acute cardiopulmonary disease. 2. Small right pleural effusion with minor associated dependent lung base atelectasis, stable compared to the prior chest radiograph. Electronically Signed   By: Lajean Manes M.D.   On: 11/28/2020 20:35   CT CHEST ABDOMEN PELVIS W CONTRAST  Result Date: 11/29/2020 CLINICAL DATA:  Acute abdominal pain. Non localized respiratory illness. EXAM: CT CHEST,  ABDOMEN, AND PELVIS WITH CONTRAST TECHNIQUE: Multidetector CT imaging of the chest, abdomen and pelvis was performed following the standard protocol during bolus administration of intravenous contrast. CONTRAST:  122m OMNIPAQUE IOHEXOL 300 MG/ML  SOLN COMPARISON:  None. FINDINGS: CT CHEST FINDINGS Cardiovascular: Normal heart size. Low-density pericardial effusion measuring up to 2.5 cm in thickness below the heart. Volume is overall moderate. Atheromatous calcification of the aorta. Mediastinum/Nodes: Negative for adenopathy or mass. Lungs/Pleura: Small to moderate right pleural effusion which is fluid density. Mild atelectasis at the right base. Musculoskeletal: Midthoracic degenerative disc space narrowing and ridging. Generalized subcutaneous edema skin thickening especially affecting the breasts. No axillary adenopathy. CT ABDOMEN PELVIS FINDINGS Hepatobiliary: No focal liver abnormality.No evidence of biliary obstruction or stone. Pancreas: Unremarkable. Spleen: Unremarkable. Adrenals/Urinary Tract: Negative adrenals. No hydronephrosis or stone. Unremarkable bladder. Stomach/Bowel: No obstruction. No visible bowel inflammation. Colonic diverticulosis. Vascular/Lymphatic: No acute vascular abnormality. Scattered atheromatous calcifications. No mass or adenopathy. Reproductive:No pathologic findings. Other: Small volume peritoneal fluid.  Body wall edema. Musculoskeletal: No acute abnormalities. IMPRESSION: 1. Volume overload with anasarca, right pleural effusion, and pericardial effusion. Pericardial effusion measures up to 2.5 cm in thickness. 2.  Aortic Atherosclerosis (ICD10-I70.0). Electronically Signed   By: JJorje GuildM.D.   On: 11/29/2020 07:20   CT L-SPINE NO CHARGE  Result Date: 11/29/2020 CLINICAL DATA:  Progressive shortness of breath EXAM: CT Lumbar Spine with contrast TECHNIQUE: Technique: Multiplanar CT images of the lumbar spine were reconstructed from contemporary CT of the Abdomen  and Pelvis. CONTRAST:  None additional COMPARISON:  Radiography five days ago FINDINGS: Segmentation: 5 lumbar type vertebrae. Alignment: Normal. Vertebrae: No acute fracture or focal pathologic process. Paraspinal and other soft tissues: Subcutaneous edema diffusely in the back which is from anasarca by source  images. Disc levels: Diffusely preserved disc height. No significant endplate or facet spurring. No visible impingement IMPRESSION: No acute finding or visible impingement. Electronically Signed   By: Jorje Guild M.D.   On: 11/29/2020 07:12   ECHOCARDIOGRAM COMPLETE  Result Date: 11/29/2020    ECHOCARDIOGRAM REPORT   Patient Name:   Peggy Pacheco Date of Exam: 11/29/2020 Medical Rec #:  [id number]       Height:       64.0 in Accession #:    [id number]      Weight:       165.0 lb Date of Birth:  30-Mar-1960       BSA:          1.803 m Patient Age:    65 years        BP:           106/55 mmHg Patient Gender: F               HR:           92 bpm. Exam Location:  [facility] Procedure: 2D Echo, Color Doppler and Cardiac Doppler STAT ECHO Indications:     I31.3 Pericardial effusion  History:         Patient has prior history of Echocardiogram examinations, most                  recent 10/19/2020. History of alcohol use                  Positive for influenza type A.  Sonographer:     Charmayne Sheer Referring Phys:  [id number] KELLY ROSE [place] Diagnosing Phys: Kathlyn Sacramento MD  Sonographer Comments: Technically difficult study due to poor echo windows. Image acquisition challenging due to respiratory motion and Image acquisition challenging due to breast implants. IMPRESSIONS  1. Left ventricular ejection fraction, by estimation, is 60 to 65%. The left ventricle has normal function. The left ventricle has no regional wall motion abnormalities. Left ventricular diastolic parameters are consistent with Grade I diastolic dysfunction (impaired relaxation).  2. Right ventricular systolic function is normal. The right  ventricular size is normal.  3. Moderate pericardial effusion. The pericardial effusion is circumferential. The effusion is only slighltly larger than last echo in September. There is no evidence of cardiac tamponade.  4. The mitral valve is normal in structure. No evidence of mitral valve regurgitation. No evidence of mitral stenosis.  5. The aortic valve is normal in structure. Aortic valve regurgitation is not visualized. Aortic valve sclerosis/calcification is present, without any evidence of aortic stenosis.  6. The inferior vena cava is normal in size with greater than 50% respiratory variability, suggesting right atrial pressure of 3 mmHg. FINDINGS  Left Ventricle: Left ventricular ejection fraction, by estimation, is 60 to 65%. The left ventricle has normal function. The left ventricle has no regional wall motion abnormalities. The left ventricular internal cavity size was normal in size. There is  no left ventricular hypertrophy. Left ventricular diastolic parameters are consistent with Grade I diastolic dysfunction (impaired relaxation). Right Ventricle: The right ventricular size is normal. No increase in right ventricular wall thickness. Right ventricular systolic function is normal. Left Atrium: Left atrial size was normal in size. Right Atrium: Right atrial size was normal in size. Pericardium: A moderately sized pericardial effusion is present. The pericardial effusion is circumferential. There is no evidence of cardiac tamponade. Mitral Valve: The mitral valve is normal in structure. No evidence of mitral valve regurgitation.  No evidence of mitral valve stenosis. MV peak gradient, 4.8 mmHg. The mean mitral valve gradient is 2.0 mmHg. Tricuspid Valve: The tricuspid valve is normal in structure. Tricuspid valve regurgitation is trivial. No evidence of tricuspid stenosis. Aortic Valve: The aortic valve is normal in structure. Aortic valve regurgitation is not visualized. Aortic valve  sclerosis/calcification is present, without any evidence of aortic stenosis. Aortic valve mean gradient measures 4.0 mmHg. Aortic valve peak  gradient measures 6.8 mmHg. Aortic valve area, by VTI measures 3.03 cm. Pulmonic Valve: The pulmonic valve was normal in structure. Pulmonic valve regurgitation is not visualized. No evidence of pulmonic stenosis. Aorta: The aortic root is normal in size and structure. Venous: The inferior vena cava is normal in size with greater than 50% respiratory variability, suggesting right atrial pressure of 3 mmHg. IAS/Shunts: No atrial level shunt detected by color flow Doppler.  LEFT VENTRICLE PLAX 2D LVIDd:         4.83 cm   Diastology LVIDs:         3.19 cm   LV e' medial:    5.87 cm/s LV PW:         0.90 cm   LV E/e' medial:  15.4 LV IVS:        0.93 cm   LV e' lateral:   7.18 cm/s LVOT diam:     2.10 cm   LV E/e' lateral: 12.6 LV SV:         55 LV SV Index:   31 LVOT Area:     3.46 cm  LEFT ATRIUM         Index LA diam:    2.80 cm 1.55 cm/m  AORTIC VALVE                    PULMONIC VALVE AV Area (Vmax):    2.60 cm     PV Vmax:       1.09 m/s AV Area (Vmean):   2.74 cm     PV Vmean:      72.600 cm/s AV Area (VTI):     3.03 cm     PV VTI:        0.159 m AV Vmax:           130.00 cm/s  PV Peak grad:  4.8 mmHg AV Vmean:          87.300 cm/s  PV Mean grad:  3.0 mmHg AV VTI:            0.182 m AV Peak Grad:      6.8 mmHg AV Mean Grad:      4.0 mmHg LVOT Vmax:         97.50 cm/s LVOT Vmean:        69.000 cm/s LVOT VTI:          0.159 m LVOT/AV VTI ratio: 0.87  AORTA Ao Root diam: 2.60 cm MITRAL VALVE MV Area (PHT): 6.96 cm     SHUNTS MV Area VTI:   3.44 cm     Systemic VTI:  0.16 m MV Peak grad:  4.8 mmHg     Systemic Diam: 2.10 cm MV Mean grad:  2.0 mmHg MV Vmax:       1.10 m/s MV Vmean:      69.4 cm/s MV Decel Time: 109 msec MV E velocity: 90.20 cm/s MV A velocity: 111.00 cm/s MV E/A ratio:  0.81 Kathlyn Sacramento MD Electronically signed by Kathlyn Sacramento MD Signature Date/Time:  11/29/2020/10:53:24 AM    Final      Assessment and Plan:   Pericardial effusion - h/o moderate pericardial effusion.Limited echo 10/18 showed it was slightly larger so lasix was increased to 4m daily - More recently patent was seen in the office and she was up 6lbs, lasix was increased 431mBID x 3 days, however patient felt it caused chest and back pain and stopped lasix for 2 days. She subsequently restarted it.  - presents with the FLU/flu-like symptoms - Labs showed normal BNP And trop. CXR with small right pleural effusion - Echo this admission showed LVEF 60-65%, moderate pericardial effusion, slightly larger, no evidence of cardiac tamponade - HR up but she is FLU positive. Monitor BP, recently was low - started on lasix 4054mID, although does not appear significantly volume up.  - Monitor kidney function with lasix - suspect she will need very little IV lasix -  Can follow-up with limited echo as OP - MD to see  Influenza A COPD Acute bronchitis - per IM  Chronic HFpEF - euvolemic on exam - she restarted lasix 40 mg around 11/23 - echo as above - lasix increased as above  Alcohol/tobacco use - she has abstained from all alcohol, down to less than 1/2 ppd smoking  For questions or updates, please contact [facility] Please consult [email or web address] for contact info under    Signed, Teairra Millar H FNinfa MeekerA-C  11/29/2020 11:24 AM

## 2020-11-29 NOTE — ED Notes (Signed)
Floor notified of the patients arrival.

## 2020-11-30 ENCOUNTER — Inpatient Hospital Stay: Payer: BC Managed Care – PPO

## 2020-11-30 ENCOUNTER — Encounter: Payer: Self-pay | Admitting: Internal Medicine

## 2020-11-30 DIAGNOSIS — J209 Acute bronchitis, unspecified: Secondary | ICD-10-CM

## 2020-11-30 DIAGNOSIS — R601 Generalized edema: Secondary | ICD-10-CM | POA: Diagnosis not present

## 2020-11-30 DIAGNOSIS — I3139 Other pericardial effusion (noninflammatory): Secondary | ICD-10-CM | POA: Diagnosis not present

## 2020-11-30 DIAGNOSIS — J440 Chronic obstructive pulmonary disease with acute lower respiratory infection: Secondary | ICD-10-CM

## 2020-11-30 LAB — BODY FLUID CELL COUNT WITH DIFFERENTIAL
Eos, Fluid: 2 %
Lymphs, Fluid: 26 %
Monocyte-Macrophage-Serous Fluid: 67 %
Neutrophil Count, Fluid: 5 %
Total Nucleated Cell Count, Fluid: 618 cu mm

## 2020-11-30 LAB — PROTEIN, PLEURAL OR PERITONEAL FLUID: Total protein, fluid: 3.6 g/dL

## 2020-11-30 LAB — BASIC METABOLIC PANEL
Anion gap: 5 (ref 5–15)
BUN: 18 mg/dL (ref 6–20)
CO2: 28 mmol/L (ref 22–32)
Calcium: 8 mg/dL — ABNORMAL LOW (ref 8.9–10.3)
Chloride: 103 mmol/L (ref 98–111)
Creatinine, Ser: 0.55 mg/dL (ref 0.44–1.00)
GFR, Estimated: >60 mL/min (ref >60)
Glucose, Bld: 106 mg/dL — ABNORMAL HIGH (ref 70–99)
Potassium: 3.6 mmol/L (ref 3.5–5.1)
Sodium: 136 mmol/L (ref 135–145)

## 2020-11-30 LAB — CBC
HCT: 45.4 % (ref 36.0–46.0)
Hemoglobin: 14.9 g/dL (ref 12.0–15.0)
MCH: 28 pg (ref 26.0–34.0)
MCHC: 32.8 g/dL (ref 30.0–36.0)
MCV: 85.3 fL (ref 80.0–100.0)
Platelets: 273 10*3/uL (ref 150–400)
RBC: 5.32 MIL/uL — ABNORMAL HIGH (ref 3.87–5.11)
RDW: 15.1 % (ref 11.5–15.5)
WBC: 9.2 10*3/uL (ref 4.0–10.5)
nRBC: 0 % (ref 0.0–0.2)

## 2020-11-30 LAB — LACTATE DEHYDROGENASE: LDH: 137 U/L (ref 98–192)

## 2020-11-30 LAB — GLUCOSE, PLEURAL OR PERITONEAL FLUID: Glucose, Fluid: 102 mg/dL

## 2020-11-30 LAB — LACTATE DEHYDROGENASE, PLEURAL OR PERITONEAL FLUID: LD, Fluid: 116 U/L — ABNORMAL HIGH (ref 3–23)

## 2020-11-30 NOTE — Progress Notes (Signed)
Progress Note    [name]  JSE:[id number] DOB: 01/16/1960  DOA: 11/29/2020 PCP: [facility]      Brief Narrative:    Medical records reviewed and are as summarized below:  Peggy Pacheco is a 60 y.o. female with medical history significant for aortic atherosclerosis, hepatic steatosis, history of alcohol use disorder (quit drinking in September 2022), tobacco use disorder, pericardial effusion (discovered in September 2022), recently hospitalized in September 2022 for anasarca.  She presented to the hospital with shortness of breath, tightness around her abdomen and generalized body swelling.  She said she had doubled up with diuretics but there was no improvement in her symptoms.  She tested positive for influenza A infection.  She was admitted to the hospital for pericardial effusion and right pleural effusion (noted on CT scan) and acute on chronic diastolic CHF.  She was given Tamiflu for influenza A infection and treated with azithromycin and bronchodilators for acute bronchitis.    Assessment/Plan:   Principal Problem:   Anasarca Active Problems:   Pericardial effusion   GERD (gastroesophageal reflux disease)   Tobacco abuse   Influenza A   COPD with acute bronchitis (HCC)    Body mass index is 28.32 kg/m.  Acute on chronic diastolic CHF, anasarca: Continue Lasix.  2D echo showed EF estimated at 60 to 07%, grade 1 diastolic dysfunction  Right pleural effusion: S/p right-sided thoracentesis with removal of 750 mL of yellowish fluid.  Follow-up pleural fluid analysis  Moderate-sized pericardial effusion: No evidence of cardiac tamponade on 2D echo.  Influenza A infection: Continue Tamiflu  COPD with acute bronchitis: Continue azithromycin and bronchodilators  Tobacco use disorder: Counseled to quit smoking cigarettes.   Diet Order             Diet NPO time specified  Diet effective now                       Consultants: Cardiologist  Procedures: Thoracentesis    Medications:    azithromycin  250 mg Oral Daily   enoxaparin (LOVENOX) injection  40 mg Subcutaneous Q24H   furosemide  40 mg Oral Daily   mometasone-formoterol  2 puff Inhalation BID   nicotine  14 mg Transdermal Daily   oseltamivir  75 mg Oral BID   pantoprazole  40 mg Oral Daily   sodium chloride flush  3 mL Intravenous Q12H   Continuous Infusions:  sodium chloride       Anti-infectives (From admission, onward)    Start     Dose/Rate Route Frequency Ordered Stop   11/30/20 1000  azithromycin (ZITHROMAX) tablet 250 mg       See Hyperspace for full Linked Orders Report.   250 mg Oral Daily 11/29/20 0940 12/04/20 0959   11/29/20 1030  oseltamivir (TAMIFLU) capsule 75 mg        75 mg Oral 2 times daily 11/29/20 0940 12/04/20 0959   11/29/20 1000  azithromycin (ZITHROMAX) tablet 500 mg       See Hyperspace for full Linked Orders Report.   500 mg Oral Daily 11/29/20 0940 11/29/20 1049              Family Communication/Anticipated D/C date and plan/Code Status   DVT prophylaxis: enoxaparin (LOVENOX) injection 40 mg Start: 11/29/20 2200     Code Status: Full Code  Family Communication: None Disposition Plan: Possible discharge to home in 1 to 2 days  Status is: Inpatient  Remains inpatient appropriate because: Exertional shortness of breath           Subjective:   C/o generalized body swelling involving the face, abdomen and lower extremities.  She feels short of breath especially with exertion.  Objective:    Vitals:   11/30/20 0631 11/30/20 0755 11/30/20 1119 11/30/20 1336  BP: 118/72 116/77 114/71 110/65  Pulse: (!) 108 (!) 104 (!) 107 100  Resp: 17 18 16    Temp: 98.6 F (37 C) 98.1 F (36.7 C) 98.5 F (36.9 C)   TempSrc:      SpO2: 96% 99% 94% 96%  Weight:      Height:       No data found.   Intake/Output Summary (Last 24 hours) at 11/30/2020 1345 Last  data filed at 11/30/2020 1000 Gross per 24 hour  Intake 20 ml  Output --  Net 20 ml   Filed Weights   11/28/20 1922  Weight: 74.8 kg    Exam:  GEN: NAD SKIN: No rash EYES: EOMI, facial edema including periorbital edema ENT: MMM CV: RRR PULM: No wheezing or rales at ABD: soft, distended, NT, +BS CNS: AAO x 3, non focal EXT: Bilateral lower extremity edema improving.  No erythema or tenderness        Data Reviewed:   I have personally reviewed following labs and imaging studies:  Labs: Labs show the following:   Basic Metabolic Panel: Recent Labs  Lab 11/28/20 1941 11/30/20 0429  NA 137 136  K 3.9 3.6  CL 104 103  CO2 26 28  GLUCOSE 133* 106*  BUN 21* 18  CREATININE 0.57 0.55  CALCIUM 8.6* 8.0*   GFR Estimated Creatinine Clearance: 74.9 mL/min (by C-G formula based on SCr of 0.55 mg/dL). Liver Function Tests: Recent Labs  Lab 11/28/20 2234  AST 25  ALT 18  ALKPHOS 72  BILITOT 0.6  PROT 6.8  ALBUMIN 3.7   No results for input(s): LIPASE, AMYLASE in the last 168 hours. No results for input(s): AMMONIA in the last 168 hours. Coagulation profile No results for input(s): INR, PROTIME in the last 168 hours.  CBC: Recent Labs  Lab 11/28/20 1941 11/30/20 0429  WBC 9.1 9.2  HGB 15.9* 14.9  HCT 48.1* 45.4  MCV 84.7 85.3  PLT 380 273   Cardiac Enzymes: No results for input(s): CKTOTAL, CKMB, CKMBINDEX, TROPONINI in the last 168 hours. BNP (last 3 results) No results for input(s): PROBNP in the last 8760 hours. CBG: No results for input(s): GLUCAP in the last 168 hours. D-Dimer: No results for input(s): DDIMER in the last 72 hours. Hgb A1c: No results for input(s): HGBA1C in the last 72 hours. Lipid Profile: No results for input(s): CHOL, HDL, LDLCALC, TRIG, CHOLHDL, LDLDIRECT in the last 72 hours. Thyroid function studies: No results for input(s): TSH, T4TOTAL, T3FREE, THYROIDAB in the last 72 hours.  Invalid input(s): FREET3 Anemia  work up: No results for input(s): VITAMINB12, FOLATE, FERRITIN, TIBC, IRON, RETICCTPCT in the last 72 hours. Sepsis Labs: Recent Labs  Lab 11/28/20 1941 11/30/20 0429  WBC 9.1 9.2    Microbiology Recent Results (from the past 240 hour(s))  Resp Panel by RT-PCR (Flu A&B, Covid) Nasopharyngeal Swab     Status: Abnormal   Collection Time: 11/29/20  6:19 AM   Specimen: Nasopharyngeal Swab; Nasopharyngeal(NP) swabs in vial transport medium  Result Value Ref Range Status   SARS Coronavirus 2 by RT PCR NEGATIVE NEGATIVE Final  Comment: (NOTE) SARS-CoV-2 target nucleic acids are NOT DETECTED.  The SARS-CoV-2 RNA is generally detectable in upper respiratory specimens during the acute phase of infection. The lowest concentration of SARS-CoV-2 viral copies this assay can detect is 138 copies/mL. A negative result does not preclude SARS-Cov-2 infection and should not be used as the sole basis for treatment or other patient management decisions. A negative result may occur with  improper specimen collection/handling, submission of specimen other than nasopharyngeal swab, presence of viral mutation(s) within the areas targeted by this assay, and inadequate number of viral copies(<138 copies/mL). A negative result must be combined with clinical observations, patient history, and epidemiological information. The expected result is Negative.  Fact Sheet for Patients:  EntrepreneurPulse.com.au  Fact Sheet for Healthcare Providers:  IncredibleEmployment.be  This test is no t yet approved or cleared by the [place] FDA and  has been authorized for detection and/or diagnosis of SARS-CoV-2 by FDA under an Emergency Use Authorization (EUA). This EUA will remain  in effect (meaning this test can be used) for the duration of the COVID-19 declaration under Section 564(b)(1) of the Act, 21 U.S.C.section 360bbb-3(b)(1), unless the authorization is terminated   or revoked sooner.       Influenza A by PCR POSITIVE (A) NEGATIVE Final   Influenza B by PCR NEGATIVE NEGATIVE Final    Comment: (NOTE) The Xpert Xpress SARS-CoV-2/FLU/RSV plus assay is intended as an aid in the diagnosis of influenza from Nasopharyngeal swab specimens and should not be used as a sole basis for treatment. Nasal washings and aspirates are unacceptable for Xpert Xpress SARS-CoV-2/FLU/RSV testing.  Fact Sheet for Patients: EntrepreneurPulse.com.au  Fact Sheet for Healthcare Providers: IncredibleEmployment.be  This test is not yet approved or cleared by the [place] FDA and has been authorized for detection and/or diagnosis of SARS-CoV-2 by FDA under an Emergency Use Authorization (EUA). This EUA will remain in effect (meaning this test can be used) for the duration of the COVID-19 declaration under Section 564(b)(1) of the Act, 21 U.S.C. section 360bbb-3(b)(1), unless the authorization is terminated or revoked.  Performed at [facility], [address], [place],  [address]     Procedures and diagnostic studies:  DG Chest 2 View  Result Date: 11/28/2020 CLINICAL DATA:  Pt c/o progressive SOB that started last Wednesday. Pt c/o swollen ABD and feels like she is overloaded with fluid. Pt has had this happen before. Pt endorses nausea. EXAM: CHEST - 2 VIEW COMPARISON:  09/13/2020 FINDINGS: Cardiac silhouette is normal in size and configuration. Normal mediastinal and hilar contours. Small right pleural effusion obscures the right hemidiaphragm, associated with minor dependent atelectasis. Lungs otherwise clear. No left pleural effusion. No pneumothorax. Skeletal structures are intact. IMPRESSION: 1. No acute cardiopulmonary disease. 2. Small right pleural effusion with minor associated dependent lung base atelectasis, stable compared to the prior chest radiograph. Electronically Signed   By: Lajean Manes M.D.    On: 11/28/2020 20:35   CT CHEST ABDOMEN PELVIS W CONTRAST  Result Date: 11/29/2020 CLINICAL DATA:  Acute abdominal pain. Non localized respiratory illness. EXAM: CT CHEST, ABDOMEN, AND PELVIS WITH CONTRAST TECHNIQUE: Multidetector CT imaging of the chest, abdomen and pelvis was performed following the standard protocol during bolus administration of intravenous contrast. CONTRAST:  163mL OMNIPAQUE IOHEXOL 300 MG/ML  SOLN COMPARISON:  None. FINDINGS: CT CHEST FINDINGS Cardiovascular: Normal heart size. Low-density pericardial effusion measuring up to 2.5 cm in thickness below the heart. Volume is overall moderate. Atheromatous calcification of the  aorta. Mediastinum/Nodes: Negative for adenopathy or mass. Lungs/Pleura: Small to moderate right pleural effusion which is fluid density. Mild atelectasis at the right base. Musculoskeletal: Midthoracic degenerative disc space narrowing and ridging. Generalized subcutaneous edema skin thickening especially affecting the breasts. No axillary adenopathy. CT ABDOMEN PELVIS FINDINGS Hepatobiliary: No focal liver abnormality.No evidence of biliary obstruction or stone. Pancreas: Unremarkable. Spleen: Unremarkable. Adrenals/Urinary Tract: Negative adrenals. No hydronephrosis or stone. Unremarkable bladder. Stomach/Bowel: No obstruction. No visible bowel inflammation. Colonic diverticulosis. Vascular/Lymphatic: No acute vascular abnormality. Scattered atheromatous calcifications. No mass or adenopathy. Reproductive:No pathologic findings. Other: Small volume peritoneal fluid.  Body wall edema. Musculoskeletal: No acute abnormalities. IMPRESSION: 1. Volume overload with anasarca, right pleural effusion, and pericardial effusion. Pericardial effusion measures up to 2.5 cm in thickness. 2.  Aortic Atherosclerosis (ICD10-I70.0). Electronically Signed   By: Jorje Guild M.D.   On: 11/29/2020 07:20   CT L-SPINE NO CHARGE  Result Date: 11/29/2020 CLINICAL DATA:  Progressive  shortness of breath EXAM: CT Lumbar Spine with contrast TECHNIQUE: Technique: Multiplanar CT images of the lumbar spine were reconstructed from contemporary CT of the Abdomen and Pelvis. CONTRAST:  None additional COMPARISON:  Radiography five days ago FINDINGS: Segmentation: 5 lumbar type vertebrae. Alignment: Normal. Vertebrae: No acute fracture or focal pathologic process. Paraspinal and other soft tissues: Subcutaneous edema diffusely in the back which is from anasarca by source images. Disc levels: Diffusely preserved disc height. No significant endplate or facet spurring. No visible impingement IMPRESSION: No acute finding or visible impingement. Electronically Signed   By: Jorje Guild M.D.   On: 11/29/2020 07:12   ECHOCARDIOGRAM COMPLETE  Result Date: 11/29/2020    ECHOCARDIOGRAM REPORT   Patient Name:   Peggy Pacheco Date of Exam: 11/29/2020 Medical Rec #:  [id number]       Height:       64.0 in Accession #:    [id number]      Weight:       165.0 lb Date of Birth:  11/19/1960       BSA:          1.803 m Patient Age:    53 years        BP:           106/55 mmHg Patient Gender: F               HR:           92 bpm. Exam Location:  [facility] Procedure: 2D Echo, Color Doppler and Cardiac Doppler STAT ECHO Indications:     I31.3 Pericardial effusion  History:         Patient has prior history of Echocardiogram examinations, most                  recent 10/19/2020. History of alcohol use                  Positive for influenza type A.  Sonographer:     Charmayne Sheer Referring Phys:  [id number] KELLY ROSE Saucier Diagnosing Phys: Kathlyn Sacramento MD  Sonographer Comments: Technically difficult study due to poor echo windows. Image acquisition challenging due to respiratory motion and Image acquisition challenging due to breast implants. IMPRESSIONS  1. Left ventricular ejection fraction, by estimation, is 60 to 65%. The left ventricle has normal function. The left ventricle has no regional wall motion abnormalities.  Left ventricular diastolic parameters are consistent with Grade I diastolic dysfunction (impaired relaxation).  2. Right ventricular systolic function is normal.  The right ventricular size is normal.  3. Moderate pericardial effusion. The pericardial effusion is circumferential. The effusion is only slighltly larger than last echo in September. There is no evidence of cardiac tamponade.  4. The mitral valve is normal in structure. No evidence of mitral valve regurgitation. No evidence of mitral stenosis.  5. The aortic valve is normal in structure. Aortic valve regurgitation is not visualized. Aortic valve sclerosis/calcification is present, without any evidence of aortic stenosis.  6. The inferior vena cava is normal in size with greater than 50% respiratory variability, suggesting right atrial pressure of 3 mmHg. FINDINGS  Left Ventricle: Left ventricular ejection fraction, by estimation, is 60 to 65%. The left ventricle has normal function. The left ventricle has no regional wall motion abnormalities. The left ventricular internal cavity size was normal in size. There is  no left ventricular hypertrophy. Left ventricular diastolic parameters are consistent with Grade I diastolic dysfunction (impaired relaxation). Right Ventricle: The right ventricular size is normal. No increase in right ventricular wall thickness. Right ventricular systolic function is normal. Left Atrium: Left atrial size was normal in size. Right Atrium: Right atrial size was normal in size. Pericardium: A moderately sized pericardial effusion is present. The pericardial effusion is circumferential. There is no evidence of cardiac tamponade. Mitral Valve: The mitral valve is normal in structure. No evidence of mitral valve regurgitation. No evidence of mitral valve stenosis. MV peak gradient, 4.8 mmHg. The mean mitral valve gradient is 2.0 mmHg. Tricuspid Valve: The tricuspid valve is normal in structure. Tricuspid valve regurgitation is  trivial. No evidence of tricuspid stenosis. Aortic Valve: The aortic valve is normal in structure. Aortic valve regurgitation is not visualized. Aortic valve sclerosis/calcification is present, without any evidence of aortic stenosis. Aortic valve mean gradient measures 4.0 mmHg. Aortic valve peak  gradient measures 6.8 mmHg. Aortic valve area, by VTI measures 3.03 cm. Pulmonic Valve: The pulmonic valve was normal in structure. Pulmonic valve regurgitation is not visualized. No evidence of pulmonic stenosis. Aorta: The aortic root is normal in size and structure. Venous: The inferior vena cava is normal in size with greater than 50% respiratory variability, suggesting right atrial pressure of 3 mmHg. IAS/Shunts: No atrial level shunt detected by color flow Doppler.  LEFT VENTRICLE PLAX 2D LVIDd:         4.83 cm   Diastology LVIDs:         3.19 cm   LV e' medial:    5.87 cm/s LV PW:         0.90 cm   LV E/e' medial:  15.4 LV IVS:        0.93 cm   LV e' lateral:   7.18 cm/s LVOT diam:     2.10 cm   LV E/e' lateral: 12.6 LV SV:         55 LV SV Index:   31 LVOT Area:     3.46 cm  LEFT ATRIUM         Index LA diam:    2.80 cm 1.55 cm/m  AORTIC VALVE                    PULMONIC VALVE AV Area (Vmax):    2.60 cm     PV Vmax:       1.09 m/s AV Area (Vmean):   2.74 cm     PV Vmean:      72.600 cm/s AV Area (VTI):     3.03 cm  PV VTI:        0.159 m AV Vmax:           130.00 cm/s  PV Peak grad:  4.8 mmHg AV Vmean:          87.300 cm/s  PV Mean grad:  3.0 mmHg AV VTI:            0.182 m AV Peak Grad:      6.8 mmHg AV Mean Grad:      4.0 mmHg LVOT Vmax:         97.50 cm/s LVOT Vmean:        69.000 cm/s LVOT VTI:          0.159 m LVOT/AV VTI ratio: 0.87  AORTA Ao Root diam: 2.60 cm MITRAL VALVE MV Area (PHT): 6.96 cm     SHUNTS MV Area VTI:   3.44 cm     Systemic VTI:  0.16 m MV Peak grad:  4.8 mmHg     Systemic Diam: 2.10 cm MV Mean grad:  2.0 mmHg MV Vmax:       1.10 m/s MV Vmean:      69.4 cm/s MV Decel Time: 109  msec MV E velocity: 90.20 cm/s MV A velocity: 111.00 cm/s MV E/A ratio:  0.81 Kathlyn Sacramento MD Electronically signed by Kathlyn Sacramento MD Signature Date/Time: 11/29/2020/10:53:24 AM    Final                LOS: 1 day   Izaih Kataoka  [facility]   Pager on [email or web address]. If 7PM-7AM, please contact night-coverage at [email or web address]     11/30/2020, 1:45 PM

## 2020-11-30 NOTE — TOC Progression Note (Signed)
Transition of Care [facility]) - Progression Note    Patient Details  Name: Peggy Pacheco MRN: [id number] Date of Birth: 1960/11/21  Transition of Care [facility]) CM/SW [place], RN Phone Number: 11/30/2020, 10:48 AM  Clinical Narrative:     Patient comes from home, Goes to [facility] and [facility], Gets medication  from [facility] in [place], [facility] to follow for needs and DC assist       Expected Discharge Plan and Services                                                 Social Determinants of Health (SDOH) Interventions    Readmission Risk Interventions No flowsheet data found.

## 2020-11-30 NOTE — Progress Notes (Signed)
Progress Note  Patient Name: Peggy Pacheco Date of Encounter: 11/30/2020  [facility] Cardiologist: Kathlyn Sacramento, MD   Subjective   IV lasix transitioned to oral lasix. Patient feels slightly better than yesterday.   Inpatient Medications    Scheduled Meds:  azithromycin  250 mg Oral Daily   enoxaparin (LOVENOX) injection  40 mg Subcutaneous Q24H   furosemide  40 mg Oral Daily   mometasone-formoterol  2 puff Inhalation BID   nicotine  14 mg Transdermal Daily   oseltamivir  75 mg Oral BID   pantoprazole  40 mg Oral Daily   sodium chloride flush  3 mL Intravenous Q12H   Continuous Infusions:  sodium chloride     PRN Meds: sodium chloride, acetaminophen **OR** acetaminophen, ipratropium-albuterol, morphine injection, ondansetron **OR** ondansetron (ZOFRAN) IV, oxyCODONE-acetaminophen, sodium chloride flush   Vital Signs    Vitals:   11/29/20 1930 11/29/20 2100 11/30/20 0631 11/30/20 0755  BP: 130/76 111/76 118/72 116/77  Pulse: 84 91 (!) 108 (!) 104  Resp: 18 13 17 18   Temp: 98.4 F (36.9 C)  98.6 F (37 C) 98.1 F (36.7 C)  TempSrc: Oral     SpO2: 99% 97% 96% 99%  Weight:      Height:        Intake/Output Summary (Last 24 hours) at 11/30/2020 1107 Last data filed at 11/30/2020 1000 Gross per 24 hour  Intake 20 ml  Output --  Net 20 ml   Last 3 Weights 11/28/2020 11/19/2020 10/01/2020  Weight (lbs) 165 lb 164 lb 158 lb 9.6 oz  Weight (kg) 74.844 kg 74.39 kg 71.94 kg      Telemetry    N/A - Personally Reviewed  ECG    No new - Personally Reviewed  Physical Exam   GEN: No acute distress.   Neck: No JVD Cardiac: RRR, no murmurs, rubs, or gallops.  Respiratory: Clear to auscultation bilaterally. GI: Soft, nontender, non-distended  MS: No edema; No deformity. Neuro:  Nonfocal  Psych: Normal affect   Labs    High Sensitivity Troponin:   Recent Labs  Lab 11/28/20 1941 11/28/20 2234 11/29/20 1108  TROPONINIHS 6 5 5       Chemistry Recent Labs  Lab 11/28/20 1941 11/28/20 2234 11/30/20 0429  NA 137  --  136  K 3.9  --  3.6  CL 104  --  103  CO2 26  --  28  GLUCOSE 133*  --  106*  BUN 21*  --  18  CREATININE 0.57  --  0.55  CALCIUM 8.6*  --  8.0*  PROT  --  6.8  --   ALBUMIN  --  3.7  --   AST  --  25  --   ALT  --  18  --   ALKPHOS  --  72  --   BILITOT  --  0.6  --   GFRNONAA >60  --  >60  ANIONGAP 7  --  5    Lipids No results for input(s): CHOL, TRIG, HDL, LABVLDL, LDLCALC, CHOLHDL in the last 168 hours.  Hematology Recent Labs  Lab 11/28/20 1941 11/30/20 0429  WBC 9.1 9.2  RBC 5.68* 5.32*  HGB 15.9* 14.9  HCT 48.1* 45.4  MCV 84.7 85.3  MCH 28.0 28.0  MCHC 33.1 32.8  RDW 15.1 15.1  PLT 380 273   Thyroid No results for input(s): TSH, FREET4 in the last 168 hours.  BNP Recent Labs  Lab 11/28/20 1941  11/29/20 1108  BNP 43.6 57.5    DDimer No results for input(s): DDIMER in the last 168 hours.   Radiology    DG Chest 2 View  Result Date: 11/28/2020 CLINICAL DATA:  Pt c/o progressive SOB that started last Wednesday. Pt c/o swollen ABD and feels like she is overloaded with fluid. Pt has had this happen before. Pt endorses nausea. EXAM: CHEST - 2 VIEW COMPARISON:  09/13/2020 FINDINGS: Cardiac silhouette is normal in size and configuration. Normal mediastinal and hilar contours. Small right pleural effusion obscures the right hemidiaphragm, associated with minor dependent atelectasis. Lungs otherwise clear. No left pleural effusion. No pneumothorax. Skeletal structures are intact. IMPRESSION: 1. No acute cardiopulmonary disease. 2. Small right pleural effusion with minor associated dependent lung base atelectasis, stable compared to the prior chest radiograph. Electronically Signed   By: Lajean Manes M.D.   On: 11/28/2020 20:35   CT CHEST ABDOMEN PELVIS W CONTRAST  Result Date: 11/29/2020 CLINICAL DATA:  Acute abdominal pain. Non localized respiratory illness. EXAM: CT CHEST,  ABDOMEN, AND PELVIS WITH CONTRAST TECHNIQUE: Multidetector CT imaging of the chest, abdomen and pelvis was performed following the standard protocol during bolus administration of intravenous contrast. CONTRAST:  175mL OMNIPAQUE IOHEXOL 300 MG/ML  SOLN COMPARISON:  None. FINDINGS: CT CHEST FINDINGS Cardiovascular: Normal heart size. Low-density pericardial effusion measuring up to 2.5 cm in thickness below the heart. Volume is overall moderate. Atheromatous calcification of the aorta. Mediastinum/Nodes: Negative for adenopathy or mass. Lungs/Pleura: Small to moderate right pleural effusion which is fluid density. Mild atelectasis at the right base. Musculoskeletal: Midthoracic degenerative disc space narrowing and ridging. Generalized subcutaneous edema skin thickening especially affecting the breasts. No axillary adenopathy. CT ABDOMEN PELVIS FINDINGS Hepatobiliary: No focal liver abnormality.No evidence of biliary obstruction or stone. Pancreas: Unremarkable. Spleen: Unremarkable. Adrenals/Urinary Tract: Negative adrenals. No hydronephrosis or stone. Unremarkable bladder. Stomach/Bowel: No obstruction. No visible bowel inflammation. Colonic diverticulosis. Vascular/Lymphatic: No acute vascular abnormality. Scattered atheromatous calcifications. No mass or adenopathy. Reproductive:No pathologic findings. Other: Small volume peritoneal fluid.  Body wall edema. Musculoskeletal: No acute abnormalities. IMPRESSION: 1. Volume overload with anasarca, right pleural effusion, and pericardial effusion. Pericardial effusion measures up to 2.5 cm in thickness. 2.  Aortic Atherosclerosis (ICD10-I70.0). Electronically Signed   By: Jorje Guild M.D.   On: 11/29/2020 07:20   CT L-SPINE NO CHARGE  Result Date: 11/29/2020 CLINICAL DATA:  Progressive shortness of breath EXAM: CT Lumbar Spine with contrast TECHNIQUE: Technique: Multiplanar CT images of the lumbar spine were reconstructed from contemporary CT of the Abdomen  and Pelvis. CONTRAST:  None additional COMPARISON:  Radiography five days ago FINDINGS: Segmentation: 5 lumbar type vertebrae. Alignment: Normal. Vertebrae: No acute fracture or focal pathologic process. Paraspinal and other soft tissues: Subcutaneous edema diffusely in the back which is from anasarca by source images. Disc levels: Diffusely preserved disc height. No significant endplate or facet spurring. No visible impingement IMPRESSION: No acute finding or visible impingement. Electronically Signed   By: Jorje Guild M.D.   On: 11/29/2020 07:12   ECHOCARDIOGRAM COMPLETE  Result Date: 11/29/2020    ECHOCARDIOGRAM REPORT   Patient Name:   LOANY NEUROTH Date of Exam: 11/29/2020 Medical Rec #:  [id number]       Height:       64.0 in Accession #:    [id number]      Weight:       165.0 lb Date of Birth:  [birth year]       BSA:  1.803 m Patient Age:    60 years        BP:           106/55 mmHg Patient Gender: F               HR:           92 bpm. Exam Location:  [facility] Procedure: 2D Echo, Color Doppler and Cardiac Doppler STAT ECHO Indications:     I31.3 Pericardial effusion  History:         Patient has prior history of Echocardiogram examinations, most                  recent 10/19/2020. History of alcohol use                  Positive for influenza type A.  Sonographer:     Charmayne Sheer Referring Phys:  [id number] KELLY ROSE Lincoln Diagnosing Phys: Kathlyn Sacramento MD  Sonographer Comments: Technically difficult study due to poor echo windows. Image acquisition challenging due to respiratory motion and Image acquisition challenging due to breast implants. IMPRESSIONS  1. Left ventricular ejection fraction, by estimation, is 60 to 65%. The left ventricle has normal function. The left ventricle has no regional wall motion abnormalities. Left ventricular diastolic parameters are consistent with Grade I diastolic dysfunction (impaired relaxation).  2. Right ventricular systolic function is normal. The right  ventricular size is normal.  3. Moderate pericardial effusion. The pericardial effusion is circumferential. The effusion is only slighltly larger than last echo in September. There is no evidence of cardiac tamponade.  4. The mitral valve is normal in structure. No evidence of mitral valve regurgitation. No evidence of mitral stenosis.  5. The aortic valve is normal in structure. Aortic valve regurgitation is not visualized. Aortic valve sclerosis/calcification is present, without any evidence of aortic stenosis.  6. The inferior vena cava is normal in size with greater than 50% respiratory variability, suggesting right atrial pressure of 3 mmHg. FINDINGS  Left Ventricle: Left ventricular ejection fraction, by estimation, is 60 to 65%. The left ventricle has normal function. The left ventricle has no regional wall motion abnormalities. The left ventricular internal cavity size was normal in size. There is  no left ventricular hypertrophy. Left ventricular diastolic parameters are consistent with Grade I diastolic dysfunction (impaired relaxation). Right Ventricle: The right ventricular size is normal. No increase in right ventricular wall thickness. Right ventricular systolic function is normal. Left Atrium: Left atrial size was normal in size. Right Atrium: Right atrial size was normal in size. Pericardium: A moderately sized pericardial effusion is present. The pericardial effusion is circumferential. There is no evidence of cardiac tamponade. Mitral Valve: The mitral valve is normal in structure. No evidence of mitral valve regurgitation. No evidence of mitral valve stenosis. MV peak gradient, 4.8 mmHg. The mean mitral valve gradient is 2.0 mmHg. Tricuspid Valve: The tricuspid valve is normal in structure. Tricuspid valve regurgitation is trivial. No evidence of tricuspid stenosis. Aortic Valve: The aortic valve is normal in structure. Aortic valve regurgitation is not visualized. Aortic valve  sclerosis/calcification is present, without any evidence of aortic stenosis. Aortic valve mean gradient measures 4.0 mmHg. Aortic valve peak  gradient measures 6.8 mmHg. Aortic valve area, by VTI measures 3.03 cm. Pulmonic Valve: The pulmonic valve was normal in structure. Pulmonic valve regurgitation is not visualized. No evidence of pulmonic stenosis. Aorta: The aortic root is normal in size and structure. Venous: The inferior vena cava is  normal in size with greater than 50% respiratory variability, suggesting right atrial pressure of 3 mmHg. IAS/Shunts: No atrial level shunt detected by color flow Doppler.  LEFT VENTRICLE PLAX 2D LVIDd:         4.83 cm   Diastology LVIDs:         3.19 cm   LV e' medial:    5.87 cm/s LV PW:         0.90 cm   LV E/e' medial:  15.4 LV IVS:        0.93 cm   LV e' lateral:   7.18 cm/s LVOT diam:     2.10 cm   LV E/e' lateral: 12.6 LV SV:         55 LV SV Index:   31 LVOT Area:     3.46 cm  LEFT ATRIUM         Index LA diam:    2.80 cm 1.55 cm/m  AORTIC VALVE                    PULMONIC VALVE AV Area (Vmax):    2.60 cm     PV Vmax:       1.09 m/s AV Area (Vmean):   2.74 cm     PV Vmean:      72.600 cm/s AV Area (VTI):     3.03 cm     PV VTI:        0.159 m AV Vmax:           130.00 cm/s  PV Peak grad:  4.8 mmHg AV Vmean:          87.300 cm/s  PV Mean grad:  3.0 mmHg AV VTI:            0.182 m AV Peak Grad:      6.8 mmHg AV Mean Grad:      4.0 mmHg LVOT Vmax:         97.50 cm/s LVOT Vmean:        69.000 cm/s LVOT VTI:          0.159 m LVOT/AV VTI ratio: 0.87  AORTA Ao Root diam: 2.60 cm MITRAL VALVE MV Area (PHT): 6.96 cm     SHUNTS MV Area VTI:   3.44 cm     Systemic VTI:  0.16 m MV Peak grad:  4.8 mmHg     Systemic Diam: 2.10 cm MV Mean grad:  2.0 mmHg MV Vmax:       1.10 m/s MV Vmean:      69.4 cm/s MV Decel Time: 109 msec MV E velocity: 90.20 cm/s MV A velocity: 111.00 cm/s MV E/A ratio:  0.81 Kathlyn Sacramento MD Electronically signed by Kathlyn Sacramento MD Signature Date/Time:  11/29/2020/10:53:24 AM    Final     Cardiac Studies   Echo 11/29/20  1. Left ventricular ejection fraction, by estimation, is 60 to 65%. The  left ventricle has normal function. The left ventricle has no regional  wall motion abnormalities. Left ventricular diastolic parameters are  consistent with Grade I diastolic  dysfunction (impaired relaxation).   2. Right ventricular systolic function is normal. The right ventricular  size is normal.   3. Moderate pericardial effusion. The pericardial effusion is  circumferential. The effusion is only slighltly larger than last echo in  September. There is no evidence of cardiac tamponade.   4. The mitral valve is normal in structure. No evidence of mitral valve  regurgitation. No evidence of mitral stenosis.   5. The aortic valve is normal in structure. Aortic valve regurgitation is  not visualized. Aortic valve sclerosis/calcification is present, without  any evidence of aortic stenosis.   6. The inferior vena cava is normal in size with greater than 50%  respiratory variability, suggesting right atrial pressure of 3 mmHg.    Echo limited 11/19/20  1. Left ventricular ejection fraction, by estimation, is 60 to 65%. The  left ventricle has normal function. The left ventricle has no regional  wall motion abnormalities.   2. Right ventricular systolic function is normal. The right ventricular  size is normal.   3. The mitral valve is normal in structure. No evidence of mitral valve  regurgitation. No evidence of mitral stenosis. Moderate mitral annular  calcification.   4. The aortic valve is normal in structure. Aortic valve regurgitation is  not visualized. No aortic stenosis is present.   5. The inferior vena cava is dilated in size with >50% respiratory  variability, suggesting right atrial pressure of 8 mmHg.   6. Moderate pericardial effusion, circumferential, estimated 1.13 to 1.3  cm, no tamponade noted. IVC not well visualized.  Grossly, no significant  RA and RV diastolic collapse Per echo tech, IVC measures 2 cm and grossly  little respiratory variation  (data not available).    Echo 09/14/20  1. Left ventricular ejection fraction, by estimation, is 60 to 65%. The  left ventricle has normal function. The left ventricle has no regional  wall motion abnormalities. Left ventricular diastolic parameters are  consistent with Grade I diastolic  dysfunction (impaired relaxation).   2. Right ventricular systolic function is normal. The right ventricular  size is normal. Mildly increased right ventricular wall thickness.   3. There is a small-moderate size pericardial effusion. The pericardial  effusion is circumferential. There is no evidence of cardiac tamponade.   4. The mitral valve is grossly normal. Trivial mitral valve  regurgitation.   5. The aortic valve has an indeterminant number of cusps. There is mild  calcification of the aortic valve. There is mild thickening of the aortic  valve. Aortic valve regurgitation is not visualized. Mild to moderate  aortic valve sclerosis/calcification  is present, without any evidence of aortic stenosis.   6. The inferior vena cava is normal in size with greater than 50%  respiratory variability, suggesting right atrial pressure of 3 mmHg.   Patient Profile     60 y.o. female with a hx of pericardial effusion, aortic atherosclerosis, hepatic steatosis, HTN, ETOH/tobacco abuse who is being seen 11/29/2020 for the evaluation of pericardial effusion.  Assessment & Plan    Pericardial effusion - h/o moderate pericardial effusion.Limited echo 10/18 showed it was slightly larger so lasix was increased to 40mg  daily - More recently patent was seen in the office and she was up 6lbs, lasix was increased 40mg  BID x 3 days, however patient felt it caused chest and back pain and stopped lasix for 2 days. She subsequently restarted it around 11/23 - presented with the FLU/flu-like  symptoms - Labs showed normal BNP And trop. CXR with small right pleural effusion - Echo this admission showed LVEF 60-65%, moderate pericardial effusion, slightly larger, no evidence of cardiac tamponade - HR up but she is FLU positive. Monitor BP, recently was low - started on lasix 40mg  BID, although does not appear significantly volume up and she was changed back to lasix 40mg  daily - Recommended thoracentesis for diagnostic pericardial and  pleural effusion, this had been ordered    Influenza A COPD Acute bronchitis - per IM   Chronic HFpEF Pleural effusion/anasarca - euvolemic on exam - she restarted lasix 40 mg around 11/23 - echo as above - continue lasix 40mg  as above - plan for thoracentesis    Alcohol/tobacco use - she has abstained from all alcohol, down to less than 1/2 ppd smoking  For questions or updates, please contact [facility] Please consult [email or web address] for contact info under        Signed, Denson Niccoli Ninfa Meeker, PA-C  11/30/2020, 11:07 AM

## 2020-11-30 NOTE — Procedures (Signed)
PROCEDURE SUMMARY:  Successful US guided right thoracentesis. Yielded 750 ml of yellow fluid. Pt tolerated procedure well. No immediate complications.  Specimen sent for labs. CXR ordered; pending results  EBL < 2 mL  Theresa Duty, NP 11/30/2020 2:08 PM

## 2020-12-01 DIAGNOSIS — J90 Pleural effusion, not elsewhere classified: Secondary | ICD-10-CM | POA: Diagnosis not present

## 2020-12-01 DIAGNOSIS — I3139 Other pericardial effusion (noninflammatory): Secondary | ICD-10-CM | POA: Diagnosis not present

## 2020-12-01 DIAGNOSIS — K219 Gastro-esophageal reflux disease without esophagitis: Secondary | ICD-10-CM

## 2020-12-01 LAB — ACID FAST SMEAR (AFB, MYCOBACTERIA): Acid Fast Smear: NEGATIVE

## 2020-12-01 NOTE — Assessment & Plan Note (Signed)
Counseled for cessation 

## 2020-12-01 NOTE — Assessment & Plan Note (Signed)
Continue Protonix °

## 2020-12-01 NOTE — Assessment & Plan Note (Signed)
Continue azithromycin, Ruthe Mannan, [place]

## 2020-12-01 NOTE — Assessment & Plan Note (Signed)
Continue Tamiflu

## 2020-12-01 NOTE — Assessment & Plan Note (Signed)
No evidence of cardiac tamponade on 2D echo.  Cardiology recommends outpatient follow-up and recheck echo in office

## 2020-12-01 NOTE — Hospital Course (Signed)
60 y.o. female with medical history significant for aortic atherosclerosis, hepatic steatosis, history of alcohol use disorder (quit drinking in September 2022), tobacco use disorder, pericardial effusion (discovered in September 2022), recently hospitalized in September 2022 for anasarca.  She presented to the hospital with shortness of breath, tightness around her abdomen and generalized body swelling.  She said she had doubled up with diuretics but there was no improvement in her symptoms.   She tested positive for influenza A infection.  She was admitted to the hospital for pericardial effusion and right pleural effusion (noted on CT scan) and acute on chronic diastolic CHF.  She was given Tamiflu for influenza A infection and treated with azithromycin and bronchodilators for acute bronchitis.  11/30: Pleural fluid studies worrisome for exudative etiology, pulmonary consult

## 2020-12-01 NOTE — Assessment & Plan Note (Signed)
S/p US guided right-sided thoracentesis with removal of 750 mL of yellowish fluid.  Fluid studies indicative of exudative effusion.  We will consult pulmonary she may have underlying connective tissue disease/sarcoidosis, lupus, malignancy - None obvious yet.  She does have family history of sarcoidosis, multiple myeloma, lupus.  May benefit from outpatient rheumatology follow-up

## 2020-12-01 NOTE — Progress Notes (Signed)
Progress Note  Patient Name: Peggy Pacheco Date of Encounter: 12/01/2020  [facility] Cardiologist: Kathlyn Sacramento, MD   Subjective   Shortness of breath is improving, muscle aches/pains improving with treatment of influenza.  Inpatient Medications    Scheduled Meds:  azithromycin  250 mg Oral Daily   enoxaparin (LOVENOX) injection  40 mg Subcutaneous Q24H   furosemide  40 mg Oral Daily   mometasone-formoterol  2 puff Inhalation BID   nicotine  14 mg Transdermal Daily   oseltamivir  75 mg Oral BID   pantoprazole  40 mg Oral Daily   sodium chloride flush  3 mL Intravenous Q12H   Continuous Infusions:  sodium chloride     PRN Meds: sodium chloride, acetaminophen **OR** acetaminophen, ipratropium-albuterol, morphine injection, ondansetron **OR** ondansetron (ZOFRAN) IV, sodium chloride flush   Vital Signs    Vitals:   11/30/20 2018 12/01/20 0544 12/01/20 0840 12/01/20 1144  BP: 120/88 126/82 125/79 134/89  Pulse: (!) 107 (!) 102 94 97  Resp: 17 18 18 18   Temp: 98 F (36.7 C) 97.6 F (36.4 C) 98.1 F (36.7 C) 98.3 F (36.8 C)  TempSrc: Oral     SpO2: 94% 91% 95% 95%  Weight:      Height:        Intake/Output Summary (Last 24 hours) at 12/01/2020 1409 Last data filed at 12/01/2020 1000 Gross per 24 hour  Intake 240 ml  Output --  Net 240 ml   Last 3 Weights 11/28/2020 11/19/2020 10/01/2020  Weight (lbs) 165 lb 164 lb 158 lb 9.6 oz  Weight (kg) 74.844 kg 74.39 kg 71.94 kg      Telemetry    Sinus rhythm, heart rate 90- Personally Reviewed  ECG     - Personally Reviewed  Physical Exam   GEN: No acute distress.   Neck: No JVD Cardiac: RRR, no murmurs, rubs, or gallops.  Respiratory: Decreased breath sounds, rhonchi bilaterally GI: Soft, nontender, non-distended  MS: No edema; No deformity. Neuro:  Nonfocal  Psych: Normal affect   Labs    High Sensitivity Troponin:   Recent Labs  Lab 11/28/20 1941 11/28/20 2234 11/29/20 1108   TROPONINIHS 6 5 5      Chemistry Recent Labs  Lab 11/28/20 1941 11/28/20 2234 11/30/20 0429  NA 137  --  136  K 3.9  --  3.6  CL 104  --  103  CO2 26  --  28  GLUCOSE 133*  --  106*  BUN 21*  --  18  CREATININE 0.57  --  0.55  CALCIUM 8.6*  --  8.0*  PROT  --  6.8  --   ALBUMIN  --  3.7  --   AST  --  25  --   ALT  --  18  --   ALKPHOS  --  72  --   BILITOT  --  0.6  --   GFRNONAA >60  --  >60  ANIONGAP 7  --  5    Lipids No results for input(s): CHOL, TRIG, HDL, LABVLDL, LDLCALC, CHOLHDL in the last 168 hours.  Hematology Recent Labs  Lab 11/28/20 1941 11/30/20 0429  WBC 9.1 9.2  RBC 5.68* 5.32*  HGB 15.9* 14.9  HCT 48.1* 45.4  MCV 84.7 85.3  MCH 28.0 28.0  MCHC 33.1 32.8  RDW 15.1 15.1  PLT 380 273   Thyroid No results for input(s): TSH, FREET4 in the last 168 hours.  BNP Recent Labs  Lab 11/28/20 1941 11/29/20 1108  BNP 43.6 57.5    DDimer No results for input(s): DDIMER in the last 168 hours.   Radiology    DG Chest Port 1 View  Result Date: 11/30/2020 CLINICAL DATA:  Post right thoracentesis. EXAM: PORTABLE CHEST 1 VIEW COMPARISON:  11/28/2020 FINDINGS: Improved aeration at the right lung base compatible with recent thoracentesis. Negative for a pneumothorax. Enlarged linear density in the right mid lung could represent atelectasis. Mild cardiomegaly. Left lung remains clear. IMPRESSION: 1. Negative for a pneumothorax following right thoracentesis. 2. Improved aeration at the right lung base. Electronically Signed   By: Markus Daft M.D.   On: 11/30/2020 14:21   US THORACENTESIS ASP PLEURAL SPACE W/IMG GUIDE  Result Date: 12/01/2020 INDICATION: Patient with a history of heart failure presents with right pleural effusion. Interventional radiology asked to perform a diagnostic and therapeutic thoracentesis. EXAM: ULTRASOUND GUIDED THORACENTESIS MEDICATIONS: 1% lidocaine 10 mL COMPLICATIONS: None immediate. PROCEDURE: An ultrasound guided thoracentesis  was thoroughly discussed with the patient and questions answered. The benefits, risks, alternatives and complications were also discussed. The patient understands and wishes to proceed with the procedure. Written consent was obtained. Ultrasound was performed to localize and mark an adequate pocket of fluid in the right chest. The area was then prepped and draped in the normal sterile fashion. 1% Lidocaine was used for local anesthesia. Under ultrasound guidance a 6 Fr Safe-T-Centesis catheter was introduced. Thoracentesis was performed. The catheter was removed and a dressing applied. FINDINGS: A total of approximately 750 mL of yellow fluid was removed. Samples were sent to the laboratory as requested by the clinical team. IMPRESSION: Successful ultrasound guided right thoracentesis yielding 750 mL of pleural fluid. Read by: Soyla Dryer, NP Electronically Signed   By: Albin Felling M.D.   On: 12/01/2020 07:55    Cardiac Studies   TTEcho 11/2020 1. Left ventricular ejection fraction, by estimation, is 60 to 65%. The  left ventricle has normal function. The left ventricle has no regional  wall motion abnormalities. Left ventricular diastolic parameters are  consistent with Grade I diastolic  dysfunction (impaired relaxation).   2. Right ventricular systolic function is normal. The right ventricular  size is normal.   3. Moderate pericardial effusion. The pericardial effusion is  circumferential. The effusion is only slighltly larger than last echo in  September. There is no evidence of cardiac tamponade.   4. The mitral valve is normal in structure. No evidence of mitral valve  regurgitation. No evidence of mitral stenosis.   5. The aortic valve is normal in structure. Aortic valve regurgitation is  not visualized. Aortic valve sclerosis/calcification is present, without  any evidence of aortic stenosis.   6. The inferior vena cava is normal in size with greater than 50%  respiratory  variability, suggesting right atrial pressure of 3 mmHg.   Patient Profile     60 y.o. female with history of hypertension, former smoker x30+ years presenting with shortness of breath.  Diagnosed with influenza, pulmonary edema, pleural effusion and moderate pericardial effusion.  Assessment & Plan    Pericardial effusion -Etiology unclear -No evidence for tamponade -?  Viral in light of current influenza infection. -Continue Lasix. -Currently hemodynamically stable, no plans for right heart cath. -Plan follow-up echocardiogram/pericardial effusion monitoring as outpatient  2.  Chronic smoker, COPD, influenza -Management as per primary team.  3.  Pleural effusion, pulmonary edema -S/p R. Thoracentesis 750 cc fluid removed.   Total encounter time 35 minutes  Greater than 50% was spent in counseling and coordination of care with the patient   Signed, Kate Sable, MD  12/01/2020, 2:09 PM
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
## 2020-12-02 DIAGNOSIS — Z720 Tobacco use: Secondary | ICD-10-CM

## 2020-12-02 DIAGNOSIS — Z9889 Other specified postprocedural states: Secondary | ICD-10-CM

## 2020-12-02 DIAGNOSIS — J101 Influenza due to other identified influenza virus with other respiratory manifestations: Secondary | ICD-10-CM

## 2020-12-02 DIAGNOSIS — I5033 Acute on chronic diastolic (congestive) heart failure: Secondary | ICD-10-CM

## 2020-12-02 LAB — CYTOLOGY - NON PAP

## 2020-12-02 MED ORDER — TORSEMIDE 20 MG PO TABS
40.0000 mg | ORAL_TABLET | Freq: Once | ORAL | Status: AC
  Administered 2020-12-02: 40 mg via ORAL
  Filled 2020-12-02: qty 2

## 2020-12-02 MED ORDER — PREDNISONE 20 MG PO TABS: 20.0000 mg | ORAL_TABLET | Freq: Every day | ORAL | 0 refills | Status: AC

## 2020-12-02 MED ORDER — MOMETASONE FURO-FORMOTEROL FUM 100-5 MCG/ACT IN AERO: 2.0000 | INHALATION_SPRAY | Freq: Two times a day (BID) | RESPIRATORY_TRACT | 0 refills | Status: DC

## 2020-12-02 MED ORDER — POTASSIUM CHLORIDE CRYS ER 20 MEQ PO TBCR: 20.0000 meq | EXTENDED_RELEASE_TABLET | Freq: Every day | ORAL | 0 refills | Status: DC

## 2020-12-02 MED ORDER — FUROSEMIDE 40 MG PO TABS: 40.0000 mg | ORAL_TABLET | Freq: Every day | ORAL | Status: DC

## 2020-12-02 MED ORDER — TORSEMIDE 40 MG PO TABS: 40.0000 mg | ORAL_TABLET | Freq: Every day | ORAL | 0 refills | Status: AC

## 2020-12-02 MED ORDER — OSELTAMIVIR PHOSPHATE 75 MG PO CAPS: 75.0000 mg | ORAL_CAPSULE | Freq: Two times a day (BID) | ORAL | 0 refills | Status: AC

## 2020-12-02 MED ORDER — PREDNISONE 20 MG PO TABS
20.0000 mg | ORAL_TABLET | Freq: Every day | ORAL | Status: DC
  Administered 2020-12-02: 20 mg via ORAL
  Filled 2020-12-02: qty 1

## 2020-12-02 NOTE — Progress Notes (Signed)
Progress Note  Patient Name: Peggy Pacheco Date of Encounter: 12/02/2020  [facility] Cardiologist: Kathlyn Sacramento, MD   Subjective   Reports that hospitalist has arranged discharge today She is concerned about her abdomen, feels swollen/distended " Does not feel normal" Reports that she has high fluid intake, lots of coffee and other beverages She has been taking Lasix 20 mg daily at home, reports it does not seem to be working well  Discussed admission symptoms, she had progressive shortness of breath, abdominal swelling, she felt she was fluid overloaded Symptoms have persisted  Echocardiogram results reviewed with her, EF 60%, moderate pericardial effusion,  Underwent thoracentesis yesterday, 750 mils removed on right  Inpatient Medications    Scheduled Meds:  azithromycin  250 mg Oral Daily   enoxaparin (LOVENOX) injection  40 mg Subcutaneous Q24H   mometasone-formoterol  2 puff Inhalation BID   nicotine  14 mg Transdermal Daily   oseltamivir  75 mg Oral BID   pantoprazole  40 mg Oral Daily   predniSONE  20 mg Oral Q breakfast   sodium chloride flush  3 mL Intravenous Q12H   Continuous Infusions:  sodium chloride     PRN Meds: sodium chloride, acetaminophen **OR** acetaminophen, ipratropium-albuterol, ondansetron **OR** ondansetron (ZOFRAN) IV, sodium chloride flush   Vital Signs    Vitals:   12/02/20 0550 12/02/20 0652 12/02/20 0832 12/02/20 1133  BP: 117/80  (!) 146/78 129/71  Pulse: 80  84 73  Resp: 18  18 18   Temp: 98.1 F (36.7 C)  97.8 F (36.6 C) 98 F (36.7 C)  TempSrc:      SpO2: 93%  99% 96%  Weight:  69.3 kg    Height:        Intake/Output Summary (Last 24 hours) at 12/02/2020 1728 Last data filed at 12/01/2020 2041 Gross per 24 hour  Intake 3 ml  Output --  Net 3 ml   Last 3 Weights 12/02/2020 11/28/2020 11/19/2020  Weight (lbs) 152 lb 12.5 oz 165 lb 164 lb  Weight (kg) 69.3 kg 74.844 kg 74.39 kg      Telemetry    Normal  sinus rhythm- Personally Reviewed  ECG    - Personally Reviewed  Physical Exam   GEN: No acute distress.   Neck: Unable to estimate JVD Cardiac: RRR, no murmurs, rubs, or gallops.  Respiratory: Clear to auscultation bilaterally, mild dullness at the bases GI: Soft, nontender, non-distended  MS: No edema; No deformity. Neuro:  Nonfocal  Psych: Normal affect   Labs    High Sensitivity Troponin:   Recent Labs  Lab 11/28/20 1941 11/28/20 2234 11/29/20 1108  TROPONINIHS 6 5 5      Chemistry Recent Labs  Lab 11/28/20 1941 11/28/20 2234 11/30/20 0429  NA 137  --  136  K 3.9  --  3.6  CL 104  --  103  CO2 26  --  28  GLUCOSE 133*  --  106*  BUN 21*  --  18  CREATININE 0.57  --  0.55  CALCIUM 8.6*  --  8.0*  PROT  --  6.8  --   ALBUMIN  --  3.7  --   AST  --  25  --   ALT  --  18  --   ALKPHOS  --  72  --   BILITOT  --  0.6  --   GFRNONAA >60  --  >60  ANIONGAP 7  --  5  Lipids No results for input(s): CHOL, TRIG, HDL, LABVLDL, LDLCALC, CHOLHDL in the last 168 hours.  Hematology Recent Labs  Lab 11/28/20 1941 11/30/20 0429  WBC 9.1 9.2  RBC 5.68* 5.32*  HGB 15.9* 14.9  HCT 48.1* 45.4  MCV 84.7 85.3  MCH 28.0 28.0  MCHC 33.1 32.8  RDW 15.1 15.1  PLT 380 273   Thyroid No results for input(s): TSH, FREET4 in the last 168 hours.  BNP Recent Labs  Lab 11/28/20 1941 11/29/20 1108  BNP 43.6 57.5    DDimer No results for input(s): DDIMER in the last 168 hours.   Radiology    No results found.  Cardiac Studies     Patient Profile     60 y.o. female with history of hypertension, former smoker x30+ years presenting with shortness of breath.  Diagnosed with influenza, pulmonary edema, pleural effusion and moderate pericardial effusion.  Assessment & Plan    Acute on chronic diastolic CHF With associated pleural effusion, pericardial effusion Still with abdominal distention Lasix 20 mg daily outpatient regiment does not appear to be working well  for her -Discussed restricting her fluid intake, she does report high coffee intake among other beverages -Discussed low-sodium diet -Recommend she stop the Lasix, will change to torsemide 40 daily with potassium 20 -May need extra 20 after lunch for continued abdominal distention We also recommended going back to torsemide 20 daily once her abdominal distention has resolved -We will arrange for close outpatient follow-up  Pleural effusion Secondary to above, underwent thoracentesis on right, 750 removed  COPD We have encouraged her to continue to work on weaning her cigarettes and smoking cessation. She will continue to work on this and does not want any assistance with chantix.    Influenza Appears to be relatively stable  Long discussion concerning her congestive heart failure symptoms, medication management Discussed fluid and salt restriction  Total encounter time more than 35 minutes  Greater than 50% was spent in counseling and coordination of care with the patient   For questions or updates, please contact [place] Please consult [email or web address] for contact info under        Signed, Ida Rogue, MD  12/02/2020, 5:28 PM

## 2020-12-02 NOTE — Consult Note (Signed)
Pulmonary Medicine          Date: 12/02/2020,   MRN# [id number] Peggy Pacheco 04-Sep-1960     AdmissionWeight: 74.8 kg                 CurrentWeight: 69.3 kg   Referring physician: Dr Manuella Ghazi   CHIEF COMPLAINT:   Acute on chronic hypoxemic respiratory failure with recurrent pleural effusions in context of sarcoidosis and SLE.    HISTORY OF PRESENT ILLNESS   This is a 60yo F with hx of PVD, EtOH abuse, hx of anasarca with recent hospitalization for such, GERD, COPD, and autoimmune history. She has reported sarcoidosis and lupus in history. She came in with respiratory distress. She had bloodwork and imaging done with findings of acute Ifluenza infection. She has been treated with Dulera, lasix, zithromax and has improved.  Cardiology is following patient due to previous pericardial effusions and cardiac dysfunction. Thoracentesis was performed 11/30/20 with removal of 750 mL of yellow appearing fluid. PCCM consultation for further evaluation and management. CT chest and abdomen reviewed independantly.    PAST MEDICAL HISTORY   Past Medical History:  Diagnosis Date  . Alcohol use    a. Quit 09/2020.  . Diastolic dysfunction   . GERD (gastroesophageal reflux disease)   . Pericardial effusion    a.09/2020 Echo: EF 60-65%, no rwma, GrI DD, Nl RV fxn. Small-mod circumferential pericardial effusion. Triv TR. Mild-mod AoV sclerosis; b. 10/2020 Echo: EF 60-65%, no rwma. Mod circumferential pericardial effusion.  . Tobacco abuse    a. Cutting back - 1/3-1/2 ppd.     SURGICAL HISTORY   Past Surgical History:  Procedure Laterality Date  . NO PAST SURGERIES       FAMILY HISTORY   Family History  Adopted: Yes  Family history unknown: Yes     SOCIAL HISTORY   Social History   Tobacco Use  . Smoking status: Every Day    Packs/day: 0.50    Years: 45.00    Pack years: 22.50    Types: Cigarettes  . Smokeless tobacco: Never  Vaping Use  . Vaping Use: Never used   Substance Use Topics  . Alcohol use: Yes    Alcohol/week: 42.0 standard drinks    Types: 42 Cans of beer per week    Comment: 6 beers per night  . Drug use: Not Currently     MEDICATIONS    Home Medication:    Current Medication:  Current Facility-Administered Medications:  .  0.9 %  sodium chloride infusion, 250 mL, Intravenous, PRN, [name], [name], MD .  acetaminophen (TYLENOL) tablet 650 mg, 650 mg, Oral, Q6H PRN, 650 mg at 11/29/20 1459 **OR** acetaminophen (TYLENOL) suppository 650 mg, 650 mg, Rectal, Q6H PRN, [name], [name], MD .  [COMPLETED] azithromycin (ZITHROMAX) tablet 500 mg, 500 mg, Oral, Daily, 500 mg at 11/29/20 1049 **FOLLOWED BY** azithromycin (ZITHROMAX) tablet 250 mg, 250 mg, Oral, Daily, [name], [name], MD, 250 mg at 12/01/20 0815 .  enoxaparin (LOVENOX) injection 40 mg, 40 mg, Subcutaneous, Q24H, [name], [name], MD, 40 mg at 12/01/20 2038 .  furosemide (LASIX) tablet 40 mg, 40 mg, Oral, Daily, Kathlyn Sacramento A, MD, 40 mg at 12/01/20 0815 .  ipratropium-albuterol (DUONEB) 0.5-2.5 (3) MG/3ML nebulizer solution 3 mL, 3 mL, Nebulization, Q6H PRN, [name], [name], MD .  mometasone-formoterol (DULERA) 100-5 MCG/ACT inhaler 2 puff, 2 puff, Inhalation, BID, [name], [name], MD, 2 puff at 12/01/20 0815 .  nicotine (NICODERM CQ - dosed in mg/24  hours) patch 14 mg, 14 mg, Transdermal, Daily, [name], [name], MD, 14 mg at 12/01/20 0815 .  ondansetron (ZOFRAN) tablet 4 mg, 4 mg, Oral, Q6H PRN **OR** ondansetron (ZOFRAN) injection 4 mg, 4 mg, Intravenous, Q6H PRN, [name], [name], MD .  oseltamivir (TAMIFLU) capsule 75 mg, 75 mg, Oral, BID, [name], [name], MD, 75 mg at 12/01/20 2038 .  pantoprazole (PROTONIX) EC tablet 40 mg, 40 mg, Oral, Daily, [name], [name], MD, 40 mg at 12/01/20 0816 .  sodium chloride flush (NS) 0.9 % injection 3 mL, 3 mL, Intravenous, Q12H, [name], [name], MD, 3 mL at 12/01/20 2041 .  sodium chloride flush (NS) 0.9 % injection 3 mL, 3  mL, Intravenous, PRN, [name], [name], MD    ALLERGIES   Patient has no known allergies.     REVIEW OF SYSTEMS    Review of Systems:  Gen:  Denies  fever, sweats, chills weigh loss  HEENT: Denies blurred vision, double vision, ear pain, eye pain, hearing loss, nose bleeds, sore throat Cardiac:  No dizziness, chest pain or heaviness, chest tightness,edema Resp:   Denies cough or sputum porduction, shortness of breath,wheezing, hemoptysis,  Gi: Denies swallowing difficulty, stomach pain, nausea or vomiting, diarrhea, constipation, bowel incontinence Gu:  Denies bladder incontinence, burning urine Ext:   Denies Joint pain, stiffness or swelling Skin: Denies  skin rash, easy bruising or bleeding or hives Endoc:  Denies polyuria, polydipsia , polyphagia or weight change Psych:   Denies depression, insomnia or hallucinations   Other:  All other systems negative   VS: BP 117/80 (BP Location: Left Arm)   Pulse 80   Temp 98.1 F (36.7 C)   Resp 18   Ht 5\' 4"  (1.626 m)   Wt 69.3 kg   SpO2 93%   BMI 26.22 kg/m      PHYSICAL EXAM    GENERAL:NAD, no fevers, chills, no weakness no fatigue HEAD: Normocephalic, atraumatic.  EYES: Pupils equal, round, reactive to light. Extraocular muscles intact. No scleral icterus.  MOUTH: Moist mucosal membrane. Dentition intact. No abscess noted.  EAR, NOSE, THROAT: Clear without exudates. No external lesions.  NECK: Supple. No thyromegaly. No nodules. No JVD.  PULMONARY: Decreased air entry on right lower lung zone.  CARDIOVASCULAR: S1 and S2. Regular rate and rhythm. No murmurs, rubs, or gallops. No edema. Pedal pulses 2+ bilaterally.  GASTROINTESTINAL: Soft, nontender, nondistended. No masses. Positive bowel sounds. No hepatosplenomegaly.  MUSCULOSKELETAL: No swelling, clubbing, or edema. Range of motion full in all extremities.  NEUROLOGIC: Cranial nerves II through XII are intact. No gross focal neurological deficits. Sensation  intact. Reflexes intact.  SKIN: No ulceration, lesions, rashes, or cyanosis. Skin warm and dry. Turgor intact.  PSYCHIATRIC: Mood, affect within normal limits. The patient is awake, alert and oriented x 3. Insight, judgment intact.       IMAGING       DG Chest 2 View  Result Date: 11/28/2020 CLINICAL DATA:  Pt c/o progressive SOB that started last Wednesday. Pt c/o swollen ABD and feels like she is overloaded with fluid. Pt has had this happen before. Pt endorses nausea. EXAM: CHEST - 2 VIEW COMPARISON:  09/13/2020 FINDINGS: Cardiac silhouette is normal in size and configuration. Normal mediastinal and hilar contours. Small right pleural effusion obscures the right hemidiaphragm, associated with minor dependent atelectasis. Lungs otherwise clear. No left pleural effusion. No pneumothorax. Skeletal structures are intact. IMPRESSION: 1. No acute cardiopulmonary disease. 2. Small right pleural effusion with minor associated dependent lung base atelectasis, stable  compared to the prior chest radiograph. Electronically Signed   By: Lajean Manes M.D.   On: 11/28/2020 20:35   DG Lumbar Spine Complete  Result Date: 11/24/2020 CLINICAL DATA:  Back pain EXAM: LUMBAR SPINE - COMPLETE 4+ VIEW COMPARISON:  None. FINDINGS: Lumbar alignment within normal limits. Vertebral body heights are maintained. The disc spaces are patent. Minimal degenerative osteophytes. Aortic atherosclerosis. IMPRESSION: Mild degenerative changes.  No acute osseous abnormality Electronically Signed   By: Donavan Foil M.D.   On: 11/24/2020 18:15   CT CHEST ABDOMEN PELVIS W CONTRAST  Result Date: 11/29/2020 CLINICAL DATA:  Acute abdominal pain. Non localized respiratory illness. EXAM: CT CHEST, ABDOMEN, AND PELVIS WITH CONTRAST TECHNIQUE: Multidetector CT imaging of the chest, abdomen and pelvis was performed following the standard protocol during bolus administration of intravenous contrast. CONTRAST:  137mL OMNIPAQUE IOHEXOL  300 MG/ML  SOLN COMPARISON:  None. FINDINGS: CT CHEST FINDINGS Cardiovascular: Normal heart size. Low-density pericardial effusion measuring up to 2.5 cm in thickness below the heart. Volume is overall moderate. Atheromatous calcification of the aorta. Mediastinum/Nodes: Negative for adenopathy or mass. Lungs/Pleura: Small to moderate right pleural effusion which is fluid density. Mild atelectasis at the right base. Musculoskeletal: Midthoracic degenerative disc space narrowing and ridging. Generalized subcutaneous edema skin thickening especially affecting the breasts. No axillary adenopathy. CT ABDOMEN PELVIS FINDINGS Hepatobiliary: No focal liver abnormality.No evidence of biliary obstruction or stone. Pancreas: Unremarkable. Spleen: Unremarkable. Adrenals/Urinary Tract: Negative adrenals. No hydronephrosis or stone. Unremarkable bladder. Stomach/Bowel: No obstruction. No visible bowel inflammation. Colonic diverticulosis. Vascular/Lymphatic: No acute vascular abnormality. Scattered atheromatous calcifications. No mass or adenopathy. Reproductive:No pathologic findings. Other: Small volume peritoneal fluid.  Body wall edema. Musculoskeletal: No acute abnormalities. IMPRESSION: 1. Volume overload with anasarca, right pleural effusion, and pericardial effusion. Pericardial effusion measures up to 2.5 cm in thickness. 2.  Aortic Atherosclerosis (ICD10-I70.0). Electronically Signed   By: Jorje Guild M.D.   On: 11/29/2020 07:20   CT L-SPINE NO CHARGE  Result Date: 11/29/2020 CLINICAL DATA:  Progressive shortness of breath EXAM: CT Lumbar Spine with contrast TECHNIQUE: Technique: Multiplanar CT images of the lumbar spine were reconstructed from contemporary CT of the Abdomen and Pelvis. CONTRAST:  None additional COMPARISON:  Radiography five days ago FINDINGS: Segmentation: 5 lumbar type vertebrae. Alignment: Normal. Vertebrae: No acute fracture or focal pathologic process. Paraspinal and other soft tissues:  Subcutaneous edema diffusely in the back which is from anasarca by source images. Disc levels: Diffusely preserved disc height. No significant endplate or facet spurring. No visible impingement IMPRESSION: No acute finding or visible impingement. Electronically Signed   By: Jorje Guild M.D.   On: 11/29/2020 07:12   DG Chest Port 1 View  Result Date: 11/30/2020 CLINICAL DATA:  Post right thoracentesis. EXAM: PORTABLE CHEST 1 VIEW COMPARISON:  11/28/2020 FINDINGS: Improved aeration at the right lung base compatible with recent thoracentesis. Negative for a pneumothorax. Enlarged linear density in the right mid lung could represent atelectasis. Mild cardiomegaly. Left lung remains clear. IMPRESSION: 1. Negative for a pneumothorax following right thoracentesis. 2. Improved aeration at the right lung base. Electronically Signed   By: Markus Daft M.D.   On: 11/30/2020 14:21   ECHOCARDIOGRAM COMPLETE  Result Date: 11/29/2020    ECHOCARDIOGRAM REPORT   Patient Name:   AAMILAH AUGENSTEIN Date of Exam: 11/29/2020 Medical Rec #:  [id number]       Height:       64.0 in Accession #:    [id number]  Weight:       165.0 lb Date of Birth:  10-Feb-1960       BSA:          1.803 m Patient Age:    60 years        BP:           106/55 mmHg Patient Gender: F               HR:           92 bpm. Exam Location:  [facility] Procedure: 2D Echo, Color Doppler and Cardiac Doppler STAT ECHO Indications:     I31.3 Pericardial effusion  History:         Patient has prior history of Echocardiogram examinations, most                  recent 10/19/2020. History of alcohol use                  Positive for influenza type A.  Sonographer:     Charmayne Sheer Referring Phys:  [id number] KELLY ROSE [place] Diagnosing Phys: Kathlyn Sacramento MD  Sonographer Comments: Technically difficult study due to poor echo windows. Image acquisition challenging due to respiratory motion and Image acquisition challenging due to breast implants. IMPRESSIONS  1. Left  ventricular ejection fraction, by estimation, is 60 to 65%. The left ventricle has normal function. The left ventricle has no regional wall motion abnormalities. Left ventricular diastolic parameters are consistent with Grade I diastolic dysfunction (impaired relaxation).  2. Right ventricular systolic function is normal. The right ventricular size is normal.  3. Moderate pericardial effusion. The pericardial effusion is circumferential. The effusion is only slighltly larger than last echo in September. There is no evidence of cardiac tamponade.  4. The mitral valve is normal in structure. No evidence of mitral valve regurgitation. No evidence of mitral stenosis.  5. The aortic valve is normal in structure. Aortic valve regurgitation is not visualized. Aortic valve sclerosis/calcification is present, without any evidence of aortic stenosis.  6. The inferior vena cava is normal in size with greater than 50% respiratory variability, suggesting right atrial pressure of 3 mmHg. FINDINGS  Left Ventricle: Left ventricular ejection fraction, by estimation, is 60 to 65%. The left ventricle has normal function. The left ventricle has no regional wall motion abnormalities. The left ventricular internal cavity size was normal in size. There is  no left ventricular hypertrophy. Left ventricular diastolic parameters are consistent with Grade I diastolic dysfunction (impaired relaxation). Right Ventricle: The right ventricular size is normal. No increase in right ventricular wall thickness. Right ventricular systolic function is normal. Left Atrium: Left atrial size was normal in size. Right Atrium: Right atrial size was normal in size. Pericardium: A moderately sized pericardial effusion is present. The pericardial effusion is circumferential. There is no evidence of cardiac tamponade. Mitral Valve: The mitral valve is normal in structure. No evidence of mitral valve regurgitation. No evidence of mitral valve stenosis. MV peak  gradient, 4.8 mmHg. The mean mitral valve gradient is 2.0 mmHg. Tricuspid Valve: The tricuspid valve is normal in structure. Tricuspid valve regurgitation is trivial. No evidence of tricuspid stenosis. Aortic Valve: The aortic valve is normal in structure. Aortic valve regurgitation is not visualized. Aortic valve sclerosis/calcification is present, without any evidence of aortic stenosis. Aortic valve mean gradient measures 4.0 mmHg. Aortic valve peak  gradient measures 6.8 mmHg. Aortic valve area, by VTI measures 3.03 cm. Pulmonic Valve: The pulmonic valve was  normal in structure. Pulmonic valve regurgitation is not visualized. No evidence of pulmonic stenosis. Aorta: The aortic root is normal in size and structure. Venous: The inferior vena cava is normal in size with greater than 50% respiratory variability, suggesting right atrial pressure of 3 mmHg. IAS/Shunts: No atrial level shunt detected by color flow Doppler.  LEFT VENTRICLE PLAX 2D LVIDd:         4.83 cm   Diastology LVIDs:         3.19 cm   LV e' medial:    5.87 cm/s LV PW:         0.90 cm   LV E/e' medial:  15.4 LV IVS:        0.93 cm   LV e' lateral:   7.18 cm/s LVOT diam:     2.10 cm   LV E/e' lateral: 12.6 LV SV:         55 LV SV Index:   31 LVOT Area:     3.46 cm  LEFT ATRIUM         Index LA diam:    2.80 cm 1.55 cm/m  AORTIC VALVE                    PULMONIC VALVE AV Area (Vmax):    2.60 cm     PV Vmax:       1.09 m/s AV Area (Vmean):   2.74 cm     PV Vmean:      72.600 cm/s AV Area (VTI):     3.03 cm     PV VTI:        0.159 m AV Vmax:           130.00 cm/s  PV Peak grad:  4.8 mmHg AV Vmean:          87.300 cm/s  PV Mean grad:  3.0 mmHg AV VTI:            0.182 m AV Peak Grad:      6.8 mmHg AV Mean Grad:      4.0 mmHg LVOT Vmax:         97.50 cm/s LVOT Vmean:        69.000 cm/s LVOT VTI:          0.159 m LVOT/AV VTI ratio: 0.87  AORTA Ao Root diam: 2.60 cm MITRAL VALVE MV Area (PHT): 6.96 cm     SHUNTS MV Area VTI:   3.44 cm     Systemic  VTI:  0.16 m MV Peak grad:  4.8 mmHg     Systemic Diam: 2.10 cm MV Mean grad:  2.0 mmHg MV Vmax:       1.10 m/s MV Vmean:      69.4 cm/s MV Decel Time: 109 msec MV E velocity: 90.20 cm/s MV A velocity: 111.00 cm/s MV E/A ratio:  0.81 Kathlyn Sacramento MD Electronically signed by Kathlyn Sacramento MD Signature Date/Time: 11/29/2020/10:53:24 AM    Final    US THORACENTESIS ASP PLEURAL SPACE W/IMG GUIDE  Result Date: 12/01/2020 INDICATION: Patient with a history of heart failure presents with right pleural effusion. Interventional radiology asked to perform a diagnostic and therapeutic thoracentesis. EXAM: ULTRASOUND GUIDED THORACENTESIS MEDICATIONS: 1% lidocaine 10 mL COMPLICATIONS: None immediate. PROCEDURE: An ultrasound guided thoracentesis was thoroughly discussed with the patient and questions answered. The benefits, risks, alternatives and complications were also discussed. The patient understands and wishes to proceed with the procedure. Written consent was obtained. Ultrasound  was performed to localize and mark an adequate pocket of fluid in the right chest. The area was then prepped and draped in the normal sterile fashion. 1% Lidocaine was used for local anesthesia. Under ultrasound guidance a 6 Fr Safe-T-Centesis catheter was introduced. Thoracentesis was performed. The catheter was removed and a dressing applied. FINDINGS: A total of approximately 750 mL of yellow fluid was removed. Samples were sent to the laboratory as requested by the clinical team. IMPRESSION: Successful ultrasound guided right thoracentesis yielding 750 mL of pleural fluid. Read by: Soyla Dryer, NP Electronically Signed   By: Albin Felling M.D.   On: 12/01/2020 07:55      ASSESSMENT/PLAN   Acute influenza A infection           - Agree with Tamiflu           - patient is having chest discomfort and bronchitic symptoms           - no SOB no hypoxemia on room air now           - adding prednisone 20 mg daily            -  patient is ready for PT/OT and dc planning           -stopping lasix - patient euvolemic with leg cramping on review of sytems  Right moderate pleural effusion              -s/p thoracentesis 750 cc theraputic aspiration              - Monocyte predominant transudate with negative gram stain - consistent with simple third spaced chronic fluid               -will need to investigate etiology on outpatient    Systemic lupus /sarcoidosis     -recommend outpatient workup and treatment      - will start 20mg  prednisone for now and can continue for 5 days without taper     - will review in office on outpatient    Pericardial effusion     - contributing to chest discomfort but patient is on room air at this time    - no evidence of tamponade - appreciate cardiology input  COPD   - continue generic COPD care path    - adding prednisone 20mg     - continue dulera    - please encourage IS    - can optimize on outpatient     - patient may be dcd home from pulmonary perspective when primary team is comfortable and we will follow up on outpatient in clinic    Thank you for allowing me to participate in the care of this patient.  Total face to face encounter time for this patient visit was >45min. >50% of the time was  spent in counseling and coordination of care.   Patient/Family are satisfied with care plan and all questions have been answered.  This document was prepared using Dragon voice recognition software and may include unintentional dictation errors.     Ottie Glazier, M.D.  Division of [place]

## 2020-12-02 NOTE — Plan of Care (Signed)
Patient discharged per MD orders at this time.All discharge instructions,education and medications reviewed with patient at the bedside.Pt expressed understanding and will comply with dc instructions.follow up appointments was also communicated to the patient.no verbal c/o or any ssx of distress.Pt was transported home by daughter in a privately owned vehicle.

## 2020-12-04 LAB — BODY FLUID CULTURE W GRAM STAIN
Culture: NO GROWTH
Gram Stain: NONE SEEN

## 2020-12-04 NOTE — Discharge Summary (Addendum)
Physician Discharge Summary   Patient name: Peggy Pacheco  Admit date:     11/29/2020  Discharge date: 12/02/2020  Discharge Physician: Max Sane   PCP: [facility]   Recommendations at discharge: f/up with outpt providers as requested  Discharge Diagnoses Principal Problem:   Anasarca Active Problems:   Pericardial effusion   Pleural effusion   GERD (gastroesophageal reflux disease)   Tobacco abuse   Influenza A   COPD with acute bronchitis ([place])   S/P thoracentesis  Hospital Course    60 y.o. female with medical history significant for aortic atherosclerosis, hepatic steatosis, history of alcohol use disorder (quit drinking in September 2022), tobacco use disorder, pericardial effusion (discovered in September 2022), recently hospitalized in September 2022 for anasarca.  She presented to the hospital with shortness of breath, tightness around her abdomen and generalized body swelling.  She said she had doubled up with diuretics but there was no improvement in her symptoms.   She tested positive for influenza A infection.  She was admitted to the hospital for pericardial effusion and right pleural effusion (noted on CT scan) and acute on chronic diastolic CHF.  She was given Tamiflu for influenza A infection and treated with azithromycin and bronchodilators for acute bronchitis.   Assessment and Plan COPD with acute bronchitis ([place]) Improved with azithromycin, Dulera, DuoNeb   Influenza A Completed course of Tamiflu   Tobacco abuse Counseled for cessation   GERD (gastroesophageal reflux disease) Continue Protonix   Pleural effusion S/p US guided right-sided thoracentesis with removal of 750 mL of yellowish fluid.  Fluid studies suggestive of monocyte predominant transudate c/w third spacing.  outpt Pulmo f/up to evaluate for connective tissue disease/sarcoidosis, lupus, malignancy. She does have family history of sarcoidosis, multiple myeloma, lupus.  May  benefit from outpatient rheumatology follow-up    Pericardial effusion No evidence of cardiac tamponade on 2D echo.  Cardiology recommends outpatient follow-up and recheck echo in office   Acute on chronic diastolic CHF With associated pleural and pericardial effusion. As Lasix wasn't very effective - cardio recommends switching to torsemide 40 mg daily with potassium 20 mg daily  Procedures performed: echo and thoracentesis   Condition at discharge: good  Exam Physical Exam   GEN: NAD SKIN: No rash EYES: EOMI ENT: MMM CV: RRR PULM: No wheezing or rales at ABD: soft, mildly distended, NT, +BS CNS: AAO x 3, non focal EXT:  No lower extremity edema  Disposition: Home  Discharge time: greater than 30 minutes.  Follow-up Information     [place], [facility]. Schedule an appointment as soon as possible for a visit in 3 day(s).   Why: [facility] Discharge F/UP; Patient to make Appt Contact information: [place] [address] [phone]         Wellington Hampshire, MD. Go on 12/14/2020.   Specialty: Cardiology Why: [facility] Discharge F/UP Appt w/ Christell Faith, PA-C;  Appt @ 8:00 am Contact information: [place] [address] [phone]         Ottie Glazier, MD. Go on 12/21/2020.   Specialty: Pulmonary Disease Why: [facility] Discharge F/UP; Appt @ 9:45 am Contact information: [place] [address] [phone]                 Allergies as of 12/02/2020   No Known Allergies      Medication List     STOP taking these  medications    furosemide 40 MG tablet Commonly known as: LASIX   naproxen 500 MG tablet Commonly known as: NAPROSYN   omeprazole 20 MG capsule Commonly known as: PRILOSEC   predniSONE 10 MG (21) Tbpk tablet Commonly known as: STERAPRED UNI-PAK 21 TAB Replaced by: predniSONE 20 MG tablet       TAKE these medications     mometasone-formoterol 100-5 MCG/ACT Aero Commonly known as: DULERA Inhale 2 puffs into the lungs 2 (two) times daily.   oseltamivir 75 MG capsule Commonly known as: TAMIFLU Take 1 capsule (75 mg total) by mouth 2 (two) times daily for 2 days.   potassium chloride SA 20 MEQ tablet Commonly known as: KLOR-CON M Take 1 tablet (20 mEq total) by mouth daily.   predniSONE 20 MG tablet Commonly known as: DELTASONE Take 1 tablet (20 mg total) by mouth daily with breakfast for 5 days. Replaces: predniSONE 10 MG (21) Tbpk tablet   Torsemide 40 MG Tabs Take 40 mg by mouth daily.        DG Chest 2 View  Result Date: 11/28/2020 CLINICAL DATA:  Pt c/o progressive SOB that started last Wednesday. Pt c/o swollen ABD and feels like she is overloaded with fluid. Pt has had this happen before. Pt endorses nausea. EXAM: CHEST - 2 VIEW COMPARISON:  09/13/2020 FINDINGS: Cardiac silhouette is normal in size and configuration. Normal mediastinal and hilar contours. Small right pleural effusion obscures the right hemidiaphragm, associated with minor dependent atelectasis. Lungs otherwise clear. No left pleural effusion. No pneumothorax. Skeletal structures are intact. IMPRESSION: 1. No acute cardiopulmonary disease. 2. Small right pleural effusion with minor associated dependent lung base atelectasis, stable compared to the prior chest radiograph. Electronically Signed   By: Lajean Manes M.D.   On: 11/28/2020 20:35   DG Lumbar Spine Complete  Result Date: 11/24/2020 CLINICAL DATA:  Back pain EXAM: LUMBAR SPINE - COMPLETE 4+ VIEW COMPARISON:  None. FINDINGS: Lumbar alignment within normal limits. Vertebral body heights are maintained. The disc spaces are patent. Minimal degenerative osteophytes. Aortic atherosclerosis. IMPRESSION: Mild degenerative changes.  No acute osseous abnormality Electronically Signed   By: Donavan Foil M.D.   On: 11/24/2020 18:15   CT CHEST ABDOMEN PELVIS W CONTRAST  Result  Date: 11/29/2020 CLINICAL DATA:  Acute abdominal pain. Non localized respiratory illness. EXAM: CT CHEST, ABDOMEN, AND PELVIS WITH CONTRAST TECHNIQUE: Multidetector CT imaging of the chest, abdomen and pelvis was performed following the standard protocol during bolus administration of intravenous contrast. CONTRAST:  159m OMNIPAQUE IOHEXOL 300 MG/ML  SOLN COMPARISON:  None. FINDINGS: CT CHEST FINDINGS Cardiovascular: Normal heart size. Low-density pericardial effusion measuring up to 2.5 cm in thickness below the heart. Volume is overall moderate. Atheromatous calcification of the aorta. Mediastinum/Nodes: Negative for adenopathy or mass. Lungs/Pleura: Small to moderate right pleural effusion which is fluid density. Mild atelectasis at the right base. Musculoskeletal: Midthoracic degenerative disc space narrowing and ridging. Generalized subcutaneous edema skin thickening especially affecting the breasts. No axillary adenopathy. CT ABDOMEN PELVIS FINDINGS Hepatobiliary: No focal liver abnormality.No evidence of biliary obstruction or stone. Pancreas: Unremarkable. Spleen: Unremarkable. Adrenals/Urinary Tract: Negative adrenals. No hydronephrosis or stone. Unremarkable bladder. Stomach/Bowel: No obstruction. No visible bowel inflammation. Colonic diverticulosis. Vascular/Lymphatic: No acute vascular abnormality. Scattered atheromatous calcifications. No mass or adenopathy. Reproductive:No pathologic findings. Other: Small volume peritoneal fluid.  Body wall edema. Musculoskeletal: No acute abnormalities. IMPRESSION: 1. Volume overload with anasarca, right pleural effusion, and pericardial effusion. Pericardial effusion measures up to 2.5 cm  in thickness. 2.  Aortic Atherosclerosis (ICD10-I70.0). Electronically Signed   By: Jorje Guild M.D.   On: 11/29/2020 07:20   CT L-SPINE NO CHARGE  Result Date: 11/29/2020 CLINICAL DATA:  Progressive shortness of breath EXAM: CT Lumbar Spine with contrast TECHNIQUE:  Technique: Multiplanar CT images of the lumbar spine were reconstructed from contemporary CT of the Abdomen and Pelvis. CONTRAST:  None additional COMPARISON:  Radiography five days ago FINDINGS: Segmentation: 5 lumbar type vertebrae. Alignment: Normal. Vertebrae: No acute fracture or focal pathologic process. Paraspinal and other soft tissues: Subcutaneous edema diffusely in the back which is from anasarca by source images. Disc levels: Diffusely preserved disc height. No significant endplate or facet spurring. No visible impingement IMPRESSION: No acute finding or visible impingement. Electronically Signed   By: Jorje Guild M.D.   On: 11/29/2020 07:12   DG Chest Port 1 View  Result Date: 11/30/2020 CLINICAL DATA:  Post right thoracentesis. EXAM: PORTABLE CHEST 1 VIEW COMPARISON:  11/28/2020 FINDINGS: Improved aeration at the right lung base compatible with recent thoracentesis. Negative for a pneumothorax. Enlarged linear density in the right mid lung could represent atelectasis. Mild cardiomegaly. Left lung remains clear. IMPRESSION: 1. Negative for a pneumothorax following right thoracentesis. 2. Improved aeration at the right lung base. Electronically Signed   By: Markus Daft M.D.   On: 11/30/2020 14:21   ECHOCARDIOGRAM COMPLETE  Result Date: 11/29/2020    ECHOCARDIOGRAM REPORT   Patient Name:   ERLEAN MEALOR Date of Exam: 11/29/2020 Medical Rec #:  [id number]       Height:       64.0 in Accession #:    [id number]      Weight:       165.0 lb Date of Birth:  21-Dec-1960       BSA:          1.803 m Patient Age:    60 years        BP:           106/55 mmHg Patient Gender: F               HR:           92 bpm. Exam Location:  [facility] Procedure: 2D Echo, Color Doppler and Cardiac Doppler STAT ECHO Indications:     I31.3 Pericardial effusion  History:         Patient has prior history of Echocardiogram examinations, most                  recent 10/19/2020. History of alcohol use                  Positive  for influenza type A.  Sonographer:     Charmayne Sheer Referring Phys:  [id number] KELLY ROSE [place] Diagnosing Phys: Kathlyn Sacramento MD  Sonographer Comments: Technically difficult study due to poor echo windows. Image acquisition challenging due to respiratory motion and Image acquisition challenging due to breast implants. IMPRESSIONS  1. Left ventricular ejection fraction, by estimation, is 60 to 65%. The left ventricle has normal function. The left ventricle has no regional wall motion abnormalities. Left ventricular diastolic parameters are consistent with Grade I diastolic dysfunction (impaired relaxation).  2. Right ventricular systolic function is normal. The right ventricular size is normal.  3. Moderate pericardial effusion. The pericardial effusion is circumferential. The effusion is only slighltly larger than last echo in September. There is no evidence of cardiac tamponade.  4. The mitral valve is  normal in structure. No evidence of mitral valve regurgitation. No evidence of mitral stenosis.  5. The aortic valve is normal in structure. Aortic valve regurgitation is not visualized. Aortic valve sclerosis/calcification is present, without any evidence of aortic stenosis.  6. The inferior vena cava is normal in size with greater than 50% respiratory variability, suggesting right atrial pressure of 3 mmHg. FINDINGS  Left Ventricle: Left ventricular ejection fraction, by estimation, is 60 to 65%. The left ventricle has normal function. The left ventricle has no regional wall motion abnormalities. The left ventricular internal cavity size was normal in size. There is  no left ventricular hypertrophy. Left ventricular diastolic parameters are consistent with Grade I diastolic dysfunction (impaired relaxation). Right Ventricle: The right ventricular size is normal. No increase in right ventricular wall thickness. Right ventricular systolic function is normal. Left Atrium: Left atrial size was normal in size. Right  Atrium: Right atrial size was normal in size. Pericardium: A moderately sized pericardial effusion is present. The pericardial effusion is circumferential. There is no evidence of cardiac tamponade. Mitral Valve: The mitral valve is normal in structure. No evidence of mitral valve regurgitation. No evidence of mitral valve stenosis. MV peak gradient, 4.8 mmHg. The mean mitral valve gradient is 2.0 mmHg. Tricuspid Valve: The tricuspid valve is normal in structure. Tricuspid valve regurgitation is trivial. No evidence of tricuspid stenosis. Aortic Valve: The aortic valve is normal in structure. Aortic valve regurgitation is not visualized. Aortic valve sclerosis/calcification is present, without any evidence of aortic stenosis. Aortic valve mean gradient measures 4.0 mmHg. Aortic valve peak  gradient measures 6.8 mmHg. Aortic valve area, by VTI measures 3.03 cm. Pulmonic Valve: The pulmonic valve was normal in structure. Pulmonic valve regurgitation is not visualized. No evidence of pulmonic stenosis. Aorta: The aortic root is normal in size and structure. Venous: The inferior vena cava is normal in size with greater than 50% respiratory variability, suggesting right atrial pressure of 3 mmHg. IAS/Shunts: No atrial level shunt detected by color flow Doppler.  LEFT VENTRICLE PLAX 2D LVIDd:         4.83 cm   Diastology LVIDs:         3.19 cm   LV e' medial:    5.87 cm/s LV PW:         0.90 cm   LV E/e' medial:  15.4 LV IVS:        0.93 cm   LV e' lateral:   7.18 cm/s LVOT diam:     2.10 cm   LV E/e' lateral: 12.6 LV SV:         55 LV SV Index:   31 LVOT Area:     3.46 cm  LEFT ATRIUM         Index LA diam:    2.80 cm 1.55 cm/m  AORTIC VALVE                    PULMONIC VALVE AV Area (Vmax):    2.60 cm     PV Vmax:       1.09 m/s AV Area (Vmean):   2.74 cm     PV Vmean:      72.600 cm/s AV Area (VTI):     3.03 cm     PV VTI:        0.159 m AV Vmax:           130.00 cm/s  PV Peak grad:  4.8 mmHg AV Vmean:  87.300 cm/s  PV Mean grad:  3.0 mmHg AV VTI:            0.182 m AV Peak Grad:      6.8 mmHg AV Mean Grad:      4.0 mmHg LVOT Vmax:         97.50 cm/s LVOT Vmean:        69.000 cm/s LVOT VTI:          0.159 m LVOT/AV VTI ratio: 0.87  AORTA Ao Root diam: 2.60 cm MITRAL VALVE MV Area (PHT): 6.96 cm     SHUNTS MV Area VTI:   3.44 cm     Systemic VTI:  0.16 m MV Peak grad:  4.8 mmHg     Systemic Diam: 2.10 cm MV Mean grad:  2.0 mmHg MV Vmax:       1.10 m/s MV Vmean:      69.4 cm/s MV Decel Time: 109 msec MV E velocity: 90.20 cm/s MV A velocity: 111.00 cm/s MV E/A ratio:  0.81 Kathlyn Sacramento MD Electronically signed by Kathlyn Sacramento MD Signature Date/Time: 11/29/2020/10:53:24 AM    Final    US THORACENTESIS ASP PLEURAL SPACE W/IMG GUIDE  Result Date: 12/01/2020 INDICATION: Patient with a history of heart failure presents with right pleural effusion. Interventional radiology asked to perform a diagnostic and therapeutic thoracentesis. EXAM: ULTRASOUND GUIDED THORACENTESIS MEDICATIONS: 1% lidocaine 10 mL COMPLICATIONS: None immediate. PROCEDURE: An ultrasound guided thoracentesis was thoroughly discussed with the patient and questions answered. The benefits, risks, alternatives and complications were also discussed. The patient understands and wishes to proceed with the procedure. Written consent was obtained. Ultrasound was performed to localize and mark an adequate pocket of fluid in the right chest. The area was then prepped and draped in the normal sterile fashion. 1% Lidocaine was used for local anesthesia. Under ultrasound guidance a 6 Fr Safe-T-Centesis catheter was introduced. Thoracentesis was performed. The catheter was removed and a dressing applied. FINDINGS: A total of approximately 750 mL of yellow fluid was removed. Samples were sent to the laboratory as requested by the clinical team. IMPRESSION: Successful ultrasound guided right thoracentesis yielding 750 mL of pleural fluid. Read by: Soyla Dryer, NP Electronically Signed   By: Albin Felling M.D.   On: 12/01/2020 07:55   Results for orders placed or performed during the hospital encounter of 11/29/20  Resp Panel by RT-PCR (Flu A&B, Covid) Nasopharyngeal Swab     Status: Abnormal   Collection Time: 11/29/20  6:19 AM   Specimen: Nasopharyngeal Swab; Nasopharyngeal(NP) swabs in vial transport medium  Result Value Ref Range Status   SARS Coronavirus 2 by RT PCR NEGATIVE NEGATIVE Final    Comment: (NOTE) SARS-CoV-2 target nucleic acids are NOT DETECTED.  The SARS-CoV-2 RNA is generally detectable in upper respiratory specimens during the acute phase of infection. The lowest concentration of SARS-CoV-2 viral copies this assay can detect is 138 copies/mL. A negative result does not preclude SARS-Cov-2 infection and should not be used as the sole basis for treatment or other patient management decisions. A negative result may occur with  improper specimen collection/handling, submission of specimen other than nasopharyngeal swab, presence of viral mutation(s) within the areas targeted by this assay, and inadequate number of viral copies(<138 copies/mL). A negative result must be combined with clinical observations, patient history, and epidemiological information. The expected result is Negative.  Fact Sheet for Patients:  EntrepreneurPulse.com.au  Fact Sheet for Healthcare Providers:  IncredibleEmployment.be  This test is no  t yet approved or cleared by the [place] and  has been authorized for detection and/or diagnosis of SARS-CoV-2 by FDA under an Emergency Use Authorization (EUA). This EUA will remain  in effect (meaning this test can be used) for the duration of the COVID-19 declaration under Section 564(b)(1) of the Act, 21 U.S.C.section 360bbb-3(b)(1), unless the authorization is terminated  or revoked sooner.       Influenza A by PCR POSITIVE (A) NEGATIVE Final    Influenza B by PCR NEGATIVE NEGATIVE Final    Comment: (NOTE) The Xpert Xpress SARS-CoV-2/FLU/RSV plus assay is intended as an aid in the diagnosis of influenza from Nasopharyngeal swab specimens and should not be used as a sole basis for treatment. Nasal washings and aspirates are unacceptable for Xpert Xpress SARS-CoV-2/FLU/RSV testing.  Fact Sheet for Patients: EntrepreneurPulse.com.au  Fact Sheet for Healthcare Providers: IncredibleEmployment.be  This test is not yet approved or cleared by the [place] FDA and has been authorized for detection and/or diagnosis of SARS-CoV-2 by FDA under an Emergency Use Authorization (EUA). This EUA will remain in effect (meaning this test can be used) for the duration of the COVID-19 declaration under Section 564(b)(1) of the Act, 21 U.S.C. section 360bbb-3(b)(1), unless the authorization is terminated or revoked.  Performed at [facility], [place], [place] [address]   Acid Fast Smear (AFB)     Status: None   Collection Time: 11/30/20  1:55 PM   Specimen: PATH Cytology Pleural fluid  Result Value Ref Range Status   AFB Specimen Processing Concentration  Final   Acid Fast Smear Negative  Final    Comment: (NOTE) Performed At: [id number] [place], [place] [id number] Rush Farmer MD [id number]    Source (AFB) PLEURAL  Final    Comment: Performed at [facility], [address] [place], [place] [address]  Body fluid culture w Gram Stain     Status: None   Collection Time: 11/30/20  1:55 PM   Specimen: PATH Cytology Pleural fluid  Result Value Ref Range Status   Specimen Description   Final    PLEURAL Performed at [facility], [address] [place], [place] [address]    Special Requests   Final    NONE Performed at [facility], [place], [place] [address]    Gram Stain NO WBC SEEN NO  ORGANISMS SEEN   Final   Culture   Final    NO GROWTH 3 DAYS Performed at [facility], [place] [address], [place],  [address]    Report Status 12/04/2020 FINAL  Final    Signed:  Max Sane MD.  [facility] 12/04/2020, 12:25 PM

## 2020-12-06 ENCOUNTER — Other Ambulatory Visit: Payer: Self-pay

## 2020-12-06 ENCOUNTER — Emergency Department
Admission: EM | Admit: 2020-12-06 | Discharge: 2020-12-06 | Disposition: A | Discharge status: Home / Self Care | Payer: BC Managed Care – PPO | Attending: Emergency Medicine | Admitting: Emergency Medicine

## 2020-12-06 DIAGNOSIS — Z5321 Procedure and treatment not carried out due to patient leaving prior to being seen by health care provider: Secondary | ICD-10-CM | POA: Diagnosis not present

## 2020-12-06 DIAGNOSIS — M545 Low back pain, unspecified: Secondary | ICD-10-CM | POA: Diagnosis present

## 2020-12-06 NOTE — ED Triage Notes (Signed)
Pt states has had low back pain for weeks. Pt states she was admitted to hospital for "fluid" last week and was receiving morphine and percocet for her back pain while in hospital. Pt states she has continued to have low back pain and has been taking someone else's percocet for pain. Pt states she does take lasix.

## 2020-12-06 NOTE — ED Notes (Signed)
Pt called to obtain vitals at 0727. Pt states that she no longer wishes to be seen, and has "a ride on the way". Pt informed at that time should be taken back soon, so pt left on board at that time.  Pt then called at 0740 to be taken back to be seen, but again refused and states she no longer wishes to be seen.

## 2020-12-08 ENCOUNTER — Ambulatory Visit: Payer: BC Managed Care – PPO | Admitting: Nurse Practitioner

## 2020-12-13 NOTE — Progress Notes (Deleted)
Cardiology Office Note    Date:  12/13/2020   ID:  MASIAH LEWING, DOB 01/19/1960, MRN [id number]  PCP:  [facility]  Cardiologist:  Kathlyn Sacramento, MD  Electrophysiologist:  None   Chief Complaint: Hospital follow-up  History of Present Illness:   Peggy Pacheco is a 60 y.o. female with history of pericardial effusion, HFpEF, aortic atherosclerosis, hepatic steatosis, HTN, COPD, significant alcohol use drinking 6 beers nightly since her 26s, and tobacco use who presents for hospital follow-up as outlined below.    She was admitted to [facility] on in 09/2020 with leg swelling, right pleural effusion, anasarca, and a moderate pericardial effusion.  High sensitivity troponin negative x 2, BNP 12.  CTA chest was negative for PE with noted moderate pericardial effusion, right pleural effusion, dependent right lower lobe consolidation, diffuse body wall edema, hepatic steatosis, and aortic atherosclerosis.  There was no obvious cirrhosis.  Lower extremity ultrasound was negative for DVT bilaterally.  Echo showed an EF of 60-65%, no RWMA, Gr1DD, normal RV systolic function and ventricular cavity size with mildly increased wall thickness, small to moderate circumferential pericardial effusion without evidence of tamponade, trivial mitral regurgitation, mildly to moderate aortic valve sclerosis without evidence of stenosis, and an estimated right atrial pressure of 3 mmHg.  There was question of symptoms possibly being in the setting of autoimmune/inflammatory process vs infectious process.  Hepatitis and HIV negative.  With echo showing preserved LVSF with Gr1DD and labs showing a normal BNP, symptoms were not felt to be related to acute CHF.  From a cardiac perspective, she was discharged on low dose Lasix with recommendation for follow up limited echo.  She was advised to follow up with GI and PCP for ongoing evaluation of her symptoms.  She was seen in follow up on 10/01/2020, and was doing  well from a cardiac perspective.  Her symptoms had resolved, outside of abdominal fullness.  Her weight has remained stable.  She had cut out all alcohol, though did continue to smoke 1/2 pack daily.  She did miss her GI appointment. Repeat echo on 10/19/2020 showed a stable moderate pericardial effusion and her Lasix was increased to 40 mg.  In follow up on 11/19/2020 she was feeling well, her abdominal fullness had resolved.  Her weight was up 6 pounds from her prior visit.  Lasix was subsequently increased to 40 mg bid x 3 days.  She was admitted from 11/28 to 12/1 with SOB and swelling.  She tested positive for influenza A.  She was found to have a right pleural effusion and underwent thoracentesis with 750 mL of largely transudative yellowish fluid removed consistent with third spacing.  Cytology negative for malignancy.  Echo showed an EF of 60-65%, no RWMA, Gr1DD, normal RVSF and ventricular cavity size, and a moderate circumferential pericardial effusion that was only slightly larger when compared to 09/2020 study without evidence of cardiac tamponade.  CT demonstrated volume overload with anasarca, right pleural effusion, and pericardial effusion.  She was transitioned from Lasix to torsemide 40 mg daily.    She was seen in the [facility] ED yesterday with abdominal bloating, low back pain and headache.  High-sensitivity troponin negative.  BNP 7.92.  Imaging was concerning for a PNA along with a right pleural effusion.  She was treated with Augmentin for possible PNA along with Flexeril for back pain and was advised to follow up with PCP and GI.   ***   Labs independently reviewed: 12/2020 -  potassium 3.7, BUN 17, serum creatinine 0.58, albumin 3.9, AST/ALT normal, Hgb 15.3, PLT 247 09/2020 - TC 170, TG 81, HDL 62, LDL 92, A1c 5.9, magnesium 1.9  Past Medical History:  Diagnosis Date   Alcohol use    a. Quit 09/2020.   Diastolic dysfunction    GERD (gastroesophageal reflux disease)     Pericardial effusion    a.09/2020 Echo: EF 60-65%, no rwma, GrI DD, Nl RV fxn. Small-mod circumferential pericardial effusion. Triv TR. Mild-mod AoV sclerosis; b. 10/2020 Echo: EF 60-65%, no rwma. Mod circumferential pericardial effusion.   Tobacco abuse    a. Cutting back - 1/3-1/2 ppd.    Past Surgical History:  Procedure Laterality Date   NO PAST SURGERIES      Current Medications: No outpatient medications have been marked as taking for the 12/14/20 encounter (Appointment) with Rise Mu, PA-C.    Allergies:   Patient has no known allergies.   Social History   Socioeconomic History   Marital status: Widowed    Spouse name: Not on file   Number of children: Not on file   Years of education: Not on file   Highest education level: Not on file  Occupational History   Not on file  Tobacco Use   Smoking status: Every Day    Packs/day: 0.50    Years: 45.00    Pack years: 22.50    Types: Cigarettes   Smokeless tobacco: Never  Vaping Use   Vaping Use: Never used  Substance and Sexual Activity   Alcohol use: Yes    Alcohol/week: 42.0 standard drinks    Types: 42 Cans of beer per week    Comment: 6 beers per night   Drug use: Not Currently   Sexual activity: Not on file  Other Topics Concern   Not on file  Social History Narrative   Not on file   Social Determinants of Health   Financial Resource Strain: Not on file  Food Insecurity: Not on file  Transportation Needs: Not on file  Physical Activity: Not on file  Stress: Not on file  Social Connections: Not on file     Family History:  The patient's She was adopted. Family history is unknown by patient.  ROS:   ROS   EKGs/Labs/Other Studies Reviewed:    Studies reviewed were summarized above. The additional studies were reviewed today:  2D echo 09/14/2020:  1. Left ventricular ejection fraction, by estimation, is 60 to 65%. The  left ventricle has normal function. The left ventricle has no regional   wall motion abnormalities. Left ventricular diastolic parameters are  consistent with Grade I diastolic  dysfunction (impaired relaxation).   2. Right ventricular systolic function is normal. The right ventricular  size is normal. Mildly increased right ventricular wall thickness.   3. There is a small-moderate size pericardial effusion. The pericardial  effusion is circumferential. There is no evidence of cardiac tamponade.   4. The mitral valve is grossly normal. Trivial mitral valve  regurgitation.   5. The aortic valve has an indeterminant number of cusps. There is mild  calcification of the aortic valve. There is mild thickening of the aortic  valve. Aortic valve regurgitation is not visualized. Mild to moderate  aortic valve sclerosis/calcification  is present, without any evidence of aortic stenosis.   6. The inferior vena cava is normal in size with greater than 50%  respiratory variability, suggesting right atrial pressure of 3 mmHg. __________  Limited echo 10/19/2020: 1.  Left ventricular ejection fraction, by estimation, is 60 to 65%. The  left ventricle has normal function. The left ventricle has no regional  wall motion abnormalities.   2. Right ventricular systolic function is normal. The right ventricular  size is normal.   3. The mitral valve is normal in structure. No evidence of mitral valve  regurgitation. No evidence of mitral stenosis. Moderate mitral annular  calcification.   4. The aortic valve is normal in structure. Aortic valve regurgitation is  not visualized. No aortic stenosis is present.   5. The inferior vena cava is dilated in size with >50% respiratory  variability, suggesting right atrial pressure of 8 mmHg.   6. Moderate pericardial effusion, circumferential, estimated 1.13 to 1.3  cm, no tamponade noted. IVC not well visualized. Grossly, no significant  RA and RV diastolic collapse Per echo tech, IVC measures 2 cm and grossly  little respiratory  variation  (data not available). __________  2D echo 11/29/2020: 1. Left ventricular ejection fraction, by estimation, is 60 to 65%. The  left ventricle has normal function. The left ventricle has no regional  wall motion abnormalities. Left ventricular diastolic parameters are  consistent with Grade I diastolic  dysfunction (impaired relaxation).   2. Right ventricular systolic function is normal. The right ventricular  size is normal.   3. Moderate pericardial effusion. The pericardial effusion is  circumferential. The effusion is only slighltly larger than last echo in  September. There is no evidence of cardiac tamponade.   4. The mitral valve is normal in structure. No evidence of mitral valve  regurgitation. No evidence of mitral stenosis.   5. The aortic valve is normal in structure. Aortic valve regurgitation is  not visualized. Aortic valve sclerosis/calcification is present, without  any evidence of aortic stenosis.   6. The inferior vena cava is normal in size with greater than 50%  respiratory variability, suggesting right atrial pressure of 3 mmHg.    EKG:  EKG is ordered today.  The EKG ordered today demonstrates ***  Recent Labs: 09/14/2020: Magnesium 1.9 11/28/2020: ALT 18 11/29/2020: B Natriuretic Peptide 57.5 11/30/2020: BUN 18; Creatinine, Ser 0.55; Hemoglobin 14.9; Platelets 273; Potassium 3.6; Sodium 136  Recent Lipid Panel    Component Value Date/Time   CHOL 170 09/14/2020 0409   TRIG 81 09/14/2020 0409   HDL 62 09/14/2020 0409   CHOLHDL 2.7 09/14/2020 0409   VLDL 16 09/14/2020 0409   LDLCALC 92 09/14/2020 0409    PHYSICAL EXAM:    VS:  There were no vitals taken for this visit.  BMI: There is no height or weight on file to calculate BMI.  Physical Exam  Wt Readings from Last 3 Encounters:  12/06/20 152 lb 1.9 oz (69 kg)  12/02/20 152 lb 12.5 oz (69.3 kg)  11/19/20 164 lb (74.4 kg)     ASSESSMENT & PLAN:   HFpEF:  Pericardial  effusion:  Pleural effusion:  Aortic atherosclerosis/HLD: LDL 92 in 09/2020.  EtOH/tobacco use:   {Are you ordering a CV Procedure (e.g. stress test, cath, DCCV, TEE, etc)?   Press F2        :[id number]}     Disposition: F/u with Dr. Fletcher Anon or an APP in ***.   Medication Adjustments/Labs and Tests Ordered: Current medicines are reviewed at length with the patient today.  Concerns regarding medicines are outlined above. Medication changes, Labs and Tests ordered today are summarized above and listed in the Patient Instructions accessible in Encounters.   Signed,  Christell Faith, PA-C 12/13/2020 8:25 AM     [facility] [address] [place] [address] [place], [place] [address] [phone]

## 2020-12-14 ENCOUNTER — Ambulatory Visit: Payer: BC Managed Care – PPO | Admitting: Physician Assistant

## 2020-12-15 ENCOUNTER — Encounter: Payer: Self-pay | Admitting: Physician Assistant

## 2020-12-21 ENCOUNTER — Other Ambulatory Visit: Payer: Self-pay | Admitting: Pulmonary Disease

## 2020-12-21 DIAGNOSIS — J849 Interstitial pulmonary disease, unspecified: Secondary | ICD-10-CM

## 2020-12-22 ENCOUNTER — Other Ambulatory Visit: Payer: Self-pay

## 2020-12-22 ENCOUNTER — Other Ambulatory Visit: Payer: Self-pay | Admitting: Pulmonary Disease

## 2020-12-22 ENCOUNTER — Ambulatory Visit
Admission: RE | Admit: 2020-12-22 | Discharge: 2020-12-22 | Disposition: A | Discharge status: Home / Self Care | Payer: BC Managed Care – PPO | Source: Ambulatory Visit | Attending: Pulmonary Disease | Admitting: Pulmonary Disease

## 2020-12-22 DIAGNOSIS — J849 Interstitial pulmonary disease, unspecified: Secondary | ICD-10-CM | POA: Insufficient documentation

## 2020-12-29 ENCOUNTER — Ambulatory Visit (INDEPENDENT_AMBULATORY_CARE_PROVIDER_SITE_OTHER): Payer: BC Managed Care – PPO

## 2020-12-29 ENCOUNTER — Other Ambulatory Visit: Payer: Self-pay

## 2020-12-29 DIAGNOSIS — I3139 Other pericardial effusion (noninflammatory): Secondary | ICD-10-CM | POA: Diagnosis not present

## 2020-12-29 LAB — ECHOCARDIOGRAM LIMITED: S' Lateral: 3.1 cm

## 2021-01-05 LAB — MISC LABCORP TEST (SEND OUT): Labcorp test code: 5367

## 2021-01-13 LAB — ACID FAST CULTURE WITH REFLEXED SENSITIVITIES (MYCOBACTERIA): Acid Fast Culture: NEGATIVE

## 2021-03-30 ENCOUNTER — Other Ambulatory Visit: Payer: Self-pay | Admitting: Family Medicine

## 2021-03-30 DIAGNOSIS — M5416 Radiculopathy, lumbar region: Secondary | ICD-10-CM

## 2021-04-12 ENCOUNTER — Ambulatory Visit
Admission: RE | Admit: 2021-04-12 | Discharge: 2021-04-12 | Disposition: A | Discharge status: Home / Self Care | Payer: BC Managed Care – PPO | Source: Ambulatory Visit | Attending: Family Medicine | Admitting: Family Medicine

## 2021-04-12 DIAGNOSIS — M5416 Radiculopathy, lumbar region: Secondary | ICD-10-CM | POA: Diagnosis present

## 2021-06-28 ENCOUNTER — Inpatient Hospital Stay: Payer: BC Managed Care – PPO | Attending: Internal Medicine

## 2021-06-28 ENCOUNTER — Other Ambulatory Visit: Payer: Self-pay

## 2021-06-28 ENCOUNTER — Encounter: Payer: Self-pay | Admitting: Internal Medicine

## 2021-06-28 ENCOUNTER — Inpatient Hospital Stay (HOSPITAL_BASED_OUTPATIENT_CLINIC_OR_DEPARTMENT_OTHER): Payer: BC Managed Care – PPO | Admitting: Internal Medicine

## 2021-06-28 ENCOUNTER — Encounter: Payer: BC Managed Care – PPO | Admitting: Licensed Clinical Social Worker

## 2021-06-28 VITALS — BP 133/87 | HR 86 | Temp 97.9°F | Ht 64.0 in | Wt 139.3 lb

## 2021-06-28 DIAGNOSIS — Z833 Family history of diabetes mellitus: Secondary | ICD-10-CM | POA: Insufficient documentation

## 2021-06-28 DIAGNOSIS — Z79899 Other long term (current) drug therapy: Secondary | ICD-10-CM | POA: Diagnosis not present

## 2021-06-28 DIAGNOSIS — M2550 Pain in unspecified joint: Secondary | ICD-10-CM | POA: Insufficient documentation

## 2021-06-28 DIAGNOSIS — Z808 Family history of malignant neoplasm of other organs or systems: Secondary | ICD-10-CM | POA: Diagnosis not present

## 2021-06-28 DIAGNOSIS — Z800 Family history of malignant neoplasm of digestive organs: Secondary | ICD-10-CM | POA: Diagnosis not present

## 2021-06-28 DIAGNOSIS — Z836 Family history of other diseases of the respiratory system: Secondary | ICD-10-CM | POA: Diagnosis not present

## 2021-06-28 DIAGNOSIS — R634 Abnormal weight loss: Secondary | ICD-10-CM | POA: Diagnosis not present

## 2021-06-28 DIAGNOSIS — M549 Dorsalgia, unspecified: Secondary | ICD-10-CM | POA: Diagnosis not present

## 2021-06-28 DIAGNOSIS — I3139 Other pericardial effusion (noninflammatory): Secondary | ICD-10-CM

## 2021-06-28 DIAGNOSIS — F17210 Nicotine dependence, cigarettes, uncomplicated: Secondary | ICD-10-CM | POA: Diagnosis not present

## 2021-06-28 DIAGNOSIS — R531 Weakness: Secondary | ICD-10-CM | POA: Insufficient documentation

## 2021-06-28 DIAGNOSIS — Z8349 Family history of other endocrine, nutritional and metabolic diseases: Secondary | ICD-10-CM | POA: Insufficient documentation

## 2021-06-28 DIAGNOSIS — Z8249 Family history of ischemic heart disease and other diseases of the circulatory system: Secondary | ICD-10-CM | POA: Diagnosis not present

## 2021-06-28 DIAGNOSIS — D472 Monoclonal gammopathy: Secondary | ICD-10-CM

## 2021-06-28 DIAGNOSIS — Z8261 Family history of arthritis: Secondary | ICD-10-CM | POA: Diagnosis not present

## 2021-06-28 DIAGNOSIS — M898X9 Other specified disorders of bone, unspecified site: Secondary | ICD-10-CM | POA: Insufficient documentation

## 2021-06-28 DIAGNOSIS — G629 Polyneuropathy, unspecified: Secondary | ICD-10-CM | POA: Insufficient documentation

## 2021-06-28 DIAGNOSIS — R5383 Other fatigue: Secondary | ICD-10-CM | POA: Diagnosis not present

## 2021-06-28 DIAGNOSIS — G609 Hereditary and idiopathic neuropathy, unspecified: Secondary | ICD-10-CM

## 2021-06-28 DIAGNOSIS — Z8269 Family history of other diseases of the musculoskeletal system and connective tissue: Secondary | ICD-10-CM | POA: Diagnosis not present

## 2021-06-28 DIAGNOSIS — Z841 Family history of disorders of kidney and ureter: Secondary | ICD-10-CM

## 2021-06-28 DIAGNOSIS — Z8379 Family history of other diseases of the digestive system: Secondary | ICD-10-CM | POA: Insufficient documentation

## 2021-06-28 LAB — LACTATE DEHYDROGENASE: LDH: 156 U/L (ref 98–192)

## 2021-06-28 LAB — COMPREHENSIVE METABOLIC PANEL
ALT: 11 U/L (ref 0–44)
AST: 20 U/L (ref 15–41)
Albumin: 4.1 g/dL (ref 3.5–5.0)
Alkaline Phosphatase: 101 U/L (ref 38–126)
Anion gap: 9 (ref 5–15)
BUN: 15 mg/dL (ref 6–20)
CO2: 27 mmol/L (ref 22–32)
Calcium: 9 mg/dL (ref 8.9–10.3)
Chloride: 100 mmol/L (ref 98–111)
Creatinine, Ser: 0.61 mg/dL (ref 0.44–1.00)
GFR, Estimated: >60 mL/min (ref >60)
Glucose, Bld: 95 mg/dL (ref 70–99)
Potassium: 4 mmol/L (ref 3.5–5.1)
Sodium: 136 mmol/L (ref 135–145)
Total Bilirubin: 0.4 mg/dL (ref 0.3–1.2)
Total Protein: 8 g/dL (ref 6.5–8.1)

## 2021-06-28 LAB — IRON AND TIBC
Iron: 51 ug/dL (ref 28–170)
Saturation Ratios: 16 % (ref 10.4–31.8)
TIBC: 311 ug/dL (ref 250–450)
UIBC: 260 ug/dL

## 2021-06-28 LAB — CBC WITH DIFFERENTIAL/PLATELET
Abs Immature Granulocytes: 0.02 10*3/uL (ref 0.00–0.07)
Basophils Absolute: 0 10*3/uL (ref 0.0–0.1)
Basophils Relative: 0 %
Eosinophils Absolute: 0.3 10*3/uL (ref 0.0–0.5)
Eosinophils Relative: 5 %
HCT: 40.6 % (ref 36.0–46.0)
Hemoglobin: 13.3 g/dL (ref 12.0–15.0)
Immature Granulocytes: 0 %
Lymphocytes Relative: 23 %
Lymphs Abs: 1.3 10*3/uL (ref 0.7–4.0)
MCH: 28.1 pg (ref 26.0–34.0)
MCHC: 32.8 g/dL (ref 30.0–36.0)
MCV: 85.8 fL (ref 80.0–100.0)
Monocytes Absolute: 0.7 10*3/uL (ref 0.1–1.0)
Monocytes Relative: 12 %
Neutro Abs: 3.6 10*3/uL (ref 1.7–7.7)
Neutrophils Relative %: 60 %
Platelets: 272 10*3/uL (ref 150–400)
RBC: 4.73 MIL/uL (ref 3.87–5.11)
RDW: 15.1 % (ref 11.5–15.5)
WBC: 6 10*3/uL (ref 4.0–10.5)
nRBC: 0 % (ref 0.0–0.2)

## 2021-06-28 LAB — VITAMIN B12: Vitamin B-12: 252 pg/mL (ref 180–914)

## 2021-06-28 LAB — FERRITIN: Ferritin: 78 ng/mL (ref 11–307)

## 2021-06-28 LAB — C-REACTIVE PROTEIN: CRP: 5.5 mg/dL — ABNORMAL HIGH (ref <1.0)

## 2021-06-29 LAB — KAPPA/LAMBDA LIGHT CHAINS
Kappa free light chain: 36.8 mg/L — ABNORMAL HIGH (ref 3.3–19.4)
Kappa, lambda light chain ratio: 1.74 — ABNORMAL HIGH (ref 0.26–1.65)
Lambda free light chains: 21.1 mg/L (ref 5.7–26.3)

## 2021-06-29 LAB — LYME DISEASE SEROLOGY W/REFLEX: Lyme Total Antibody EIA: NEGATIVE

## 2021-07-05 LAB — MULTIPLE MYELOMA PANEL, SERUM
Albumin SerPl Elph-Mcnc: 3.8 g/dL (ref 2.9–4.4)
Albumin/Glob SerPl: 1.1 (ref 0.7–1.7)
Alpha 1: 0.4 g/dL (ref 0.0–0.4)
Alpha2 Glob SerPl Elph-Mcnc: 0.8 g/dL (ref 0.4–1.0)
B-Globulin SerPl Elph-Mcnc: 1 g/dL (ref 0.7–1.3)
Gamma Glob SerPl Elph-Mcnc: 1.5 g/dL (ref 0.4–1.8)
Globulin, Total: 3.6 g/dL (ref 2.2–3.9)
IgA: 239 mg/dL (ref 87–352)
IgG (Immunoglobin G), Serum: 1429 mg/dL (ref 586–1602)
IgM (Immunoglobulin M), Srm: 166 mg/dL (ref 26–217)
Total Protein ELP: 7.4 g/dL (ref 6.0–8.5)

## 2021-07-07 ENCOUNTER — Ambulatory Visit: Payer: BC Managed Care – PPO | Attending: Neurology | Admitting: Physical Therapy

## 2021-07-07 ENCOUNTER — Encounter: Payer: Self-pay | Admitting: Physical Therapy

## 2021-07-07 DIAGNOSIS — R2689 Other abnormalities of gait and mobility: Secondary | ICD-10-CM | POA: Diagnosis present

## 2021-07-07 DIAGNOSIS — R262 Difficulty in walking, not elsewhere classified: Secondary | ICD-10-CM | POA: Insufficient documentation

## 2021-07-07 DIAGNOSIS — M6281 Muscle weakness (generalized): Secondary | ICD-10-CM | POA: Diagnosis present

## 2021-07-07 DIAGNOSIS — M25612 Stiffness of left shoulder, not elsewhere classified: Secondary | ICD-10-CM | POA: Diagnosis present

## 2021-07-07 NOTE — Therapy (Signed)
[facility] [address] [place], [place], [address] Phone: [phone]   Fax:  [phone]  Physical Therapy Evaluation  Patient Details  Name: Peggy Pacheco MRN: [id number] Date of Birth: 05-15-1960 No data recorded  Encounter Date: 07/07/2021   PT End of Session - 07/07/21 1048     Visit Number 1    Number of Visits 1    Date for PT Re-Evaluation 07/08/21    PT Start Time 0738    PT Stop Time 0916    PT Time Calculation (min) 98 min    Equipment Utilized During Treatment Gait belt    Activity Tolerance Patient tolerated treatment well;Patient limited by fatigue;Patient limited by pain    Behavior During Therapy WFL for tasks assessed/performed             Past Medical History:  Diagnosis Date   Alcohol use    a. Quit 09/2020.   Diastolic dysfunction    GERD (gastroesophageal reflux disease)    Pericardial effusion    a.09/2020 Echo: EF 60-65%, no rwma, GrI DD, Nl RV fxn. Small-mod circumferential pericardial effusion. Triv TR. Mild-mod AoV sclerosis; b. 10/2020 Echo: EF 60-65%, no rwma. Mod circumferential pericardial effusion.   Tobacco abuse    a. Cutting back - 1/3-1/2 ppd.    Past Surgical History:  Procedure Laterality Date   NO PAST SURGERIES      There were no vitals filed for this visit.    Subjective Assessment - 07/07/21 1047     Subjective See FCE report    Patient Stated Goals Complete FCE/ disability paperwork    Currently in Pain? Yes    Pain Score 8     Pain Location Shoulder    Pain Orientation Left              FCE report and disability paperwork completed and faxed to Dr. Melrose Nakayama office on 07/07/21 at 11:00AM     Plan - 07/07/21 1049     Clinical Impression Statement Overall Level of Work: Falls within the Sedentary range.  Exerting up to 10 pounds of force occasionally (Occasionally: activity or condition exist up to 1/3 or the time) and/or a negligible amount of force frequently  (Frequently: activity or condition exist from 1/3 to 2/3 or the time) to lift, carry, push, pull, or otherwise move objects, including the human body.  Sedentary work involves sitting most of the time, but may involve walking or standing for brief periods of time.  Jobs are sedentary if walking and standing are required only occasionally and all other sedentary criteria are met.    Please see the Task Performance Table for specific abilities.  Tolerance for the 8-Hour Day: Based on the individual task scores in Dynamic Strength, Position Tolerance and Mobility, the client is able to tolerate the Sedentary level of work for the 8-hour day/40-hour week.    Stability/Clinical Decision Making Evolving/Moderate complexity    Clinical Decision Making Moderate    Rehab Potential Good    PT Frequency One time visit    PT Treatment/Interventions ADLs/Self Care Home Management;Therapeutic activities;Functional mobility training;Stair training;Gait training;Balance training;Neuromuscular re-education             Patient will benefit from skilled therapeutic intervention in order to improve the following deficits and impairments:  Abnormal gait, Decreased balance, Decreased endurance, Decreased mobility, Difficulty walking, Impaired sensation, Improper body mechanics, Decreased range of motion, Decreased activity tolerance, Decreased strength, Impaired flexibility, Postural dysfunction,  Pain, Impaired UE functional use  Visit Diagnosis: Difficulty in walking, not elsewhere classified  Shoulder joint stiffness, left  Muscle weakness (generalized)  Balance disorder     Problem List Patient Active Problem List   Diagnosis Date Noted   Monoclonal gammopathy of unknown significance (MGUS) 06/28/2021   S/P thoracentesis    Anasarca 11/29/2020   Influenza A 11/29/2020   COPD with acute bronchitis ([place]) 11/29/2020   Peripheral edema    Pericardial effusion 09/13/2020   Pleural effusion 09/13/2020    Bilateral leg edema 09/13/2020   Numbness in both arms and submandibular area 09/13/2020   Tobacco abuse 09/13/2020   Alcohol use 09/13/2020   SOB (shortness of breath) 09/13/2020   Hypertension 09/13/2020   GERD (gastroesophageal reflux disease)    Pura Spice, PT, DPT # [id number] 07/07/2021, 10:52 AM  [facility] [address] [place], [place], [address] Phone: [phone]   Fax:  [phone]  Name: Peggy Pacheco MRN: [id number] Date of Birth: 14-Oct-1960

## 2021-07-12 ENCOUNTER — Encounter: Payer: Self-pay | Admitting: Internal Medicine

## 2021-07-12 ENCOUNTER — Inpatient Hospital Stay: Payer: BC Managed Care – PPO | Attending: Internal Medicine | Admitting: Internal Medicine

## 2021-07-12 DIAGNOSIS — R5383 Other fatigue: Secondary | ICD-10-CM | POA: Insufficient documentation

## 2021-07-12 DIAGNOSIS — M2550 Pain in unspecified joint: Secondary | ICD-10-CM | POA: Insufficient documentation

## 2021-07-12 DIAGNOSIS — Z808 Family history of malignant neoplasm of other organs or systems: Secondary | ICD-10-CM | POA: Insufficient documentation

## 2021-07-12 DIAGNOSIS — G629 Polyneuropathy, unspecified: Secondary | ICD-10-CM | POA: Insufficient documentation

## 2021-07-12 DIAGNOSIS — Z841 Family history of disorders of kidney and ureter: Secondary | ICD-10-CM | POA: Insufficient documentation

## 2021-07-12 DIAGNOSIS — Z836 Family history of other diseases of the respiratory system: Secondary | ICD-10-CM | POA: Insufficient documentation

## 2021-07-12 DIAGNOSIS — F17210 Nicotine dependence, cigarettes, uncomplicated: Secondary | ICD-10-CM | POA: Diagnosis not present

## 2021-07-12 DIAGNOSIS — Z8379 Family history of other diseases of the digestive system: Secondary | ICD-10-CM | POA: Insufficient documentation

## 2021-07-12 DIAGNOSIS — D472 Monoclonal gammopathy: Secondary | ICD-10-CM | POA: Diagnosis present

## 2021-07-12 DIAGNOSIS — R634 Abnormal weight loss: Secondary | ICD-10-CM | POA: Diagnosis not present

## 2021-07-12 DIAGNOSIS — M898X9 Other specified disorders of bone, unspecified site: Secondary | ICD-10-CM | POA: Diagnosis not present

## 2021-07-12 DIAGNOSIS — Z8261 Family history of arthritis: Secondary | ICD-10-CM | POA: Diagnosis not present

## 2021-07-12 DIAGNOSIS — R251 Tremor, unspecified: Secondary | ICD-10-CM | POA: Diagnosis not present

## 2021-07-12 DIAGNOSIS — Z800 Family history of malignant neoplasm of digestive organs: Secondary | ICD-10-CM | POA: Insufficient documentation

## 2021-07-12 DIAGNOSIS — F1290 Cannabis use, unspecified, uncomplicated: Secondary | ICD-10-CM | POA: Insufficient documentation

## 2021-07-12 DIAGNOSIS — Z79899 Other long term (current) drug therapy: Secondary | ICD-10-CM | POA: Diagnosis not present

## 2021-07-12 DIAGNOSIS — R531 Weakness: Secondary | ICD-10-CM | POA: Diagnosis not present

## 2021-07-12 DIAGNOSIS — Z8249 Family history of ischemic heart disease and other diseases of the circulatory system: Secondary | ICD-10-CM | POA: Diagnosis not present

## 2021-07-12 DIAGNOSIS — Z833 Family history of diabetes mellitus: Secondary | ICD-10-CM | POA: Insufficient documentation

## 2021-07-12 DIAGNOSIS — M549 Dorsalgia, unspecified: Secondary | ICD-10-CM | POA: Diagnosis not present

## 2021-07-12 NOTE — Progress Notes (Unsigned)
[facility] [place] CONSULT NOTE  Patient Care Team: [facility] as PCP - General Wellington Hampshire, MD as PCP - Cardiology (Cardiology) Cammie Sickle, MD as Consulting Physician (Oncology)  CHIEF COMPLAINTS/PURPOSE OF CONSULTATION: Monoclonal gammopathy  HEMATOLOGY HISTORY  # MARCH 2023- IFE Serum [Dr.Potter]There is a region of constricted electrophoretic mobility in lambda light chain, which is of unclear significance.   #Severe peripheral neuropathy [s/p flu-nov 2022]  HISTORY OF PRESENTING ILLNESS: Ambulating independently.  Accompanied by her sister.  Peggy Pacheco 61 y.o.  female is here to review the results of the blood work ordered for MGUS.  Patient continues to have worsening tingling and numbness in extremities.  Significant weight loss.  Worsening bone pain/joint pains.  Review of Systems  Constitutional:  Positive for malaise/fatigue and weight loss. Negative for chills, diaphoresis and fever.  HENT:  Negative for nosebleeds and sore throat.   Eyes:  Negative for double vision.  Respiratory:  Negative for cough, hemoptysis, sputum production, shortness of breath and wheezing.   Cardiovascular:  Negative for chest pain, palpitations, orthopnea and leg swelling.  Gastrointestinal:  Negative for abdominal pain, blood in stool, constipation, diarrhea, heartburn, melena, nausea and vomiting.  Genitourinary:  Negative for dysuria, frequency and urgency.  Musculoskeletal:  Positive for back pain and joint pain.  Skin: Negative.  Negative for itching and rash.  Neurological:  Positive for tingling, tremors, sensory change and weakness. Negative for dizziness, focal weakness and headaches.  Endo/Heme/Allergies:  Does not bruise/bleed easily.  Psychiatric/Behavioral:  Negative for depression. The patient is not nervous/anxious and does not have insomnia.     MEDICAL HISTORY:  Past Medical History:  Diagnosis Date   Alcohol use    a. Quit 09/2020.    Diastolic dysfunction    GERD (gastroesophageal reflux disease)    Pericardial effusion    a.09/2020 Echo: EF 60-65%, no rwma, GrI DD, Nl RV fxn. Small-mod circumferential pericardial effusion. Triv TR. Mild-mod AoV sclerosis; b. 10/2020 Echo: EF 60-65%, no rwma. Mod circumferential pericardial effusion.   Tobacco abuse    a. Cutting back - 1/3-1/2 ppd.    SURGICAL HISTORY: Past Surgical History:  Procedure Laterality Date   NO PAST SURGERIES      SOCIAL HISTORY: Social History   Socioeconomic History   Marital status: Widowed    Spouse name: Not on file   Number of children: Not on file   Years of education: Not on file   Highest education level: Not on file  Occupational History   Not on file  Tobacco Use   Smoking status: Every Day    Packs/day: 0.50    Years: 45.00    Total pack years: 22.50    Types: Cigarettes   Smokeless tobacco: Never  Vaping Use   Vaping Use: Never used  Substance and Sexual Activity   Alcohol use: Yes    Comment: occassionally   Drug use: Yes    Types: Marijuana   Sexual activity: Not Currently  Other Topics Concern   Not on file  Social History Narrative   Patient lives in [place].  Active smoker.  Used to work in a [employer]; unable to [employer] December 2022.    Social Determinants of Health   Financial Resource Strain: Not on file  Food Insecurity: Not on file  Transportation Needs: Not on file  Physical Activity: Not on file  Stress: Not on file  Social Connections: Not on file  Intimate Partner Violence: Not on  file    FAMILY HISTORY: Family History  Adopted: Yes  Problem Relation Age of Onset   Congestive Heart Failure Mother    Kidney failure Mother    Asthma Mother    Colon cancer Father    Diabetes Father    Multiple myeloma Sister    Melanoma Sister    Congestive Heart Failure Sister    Lupus Sister    Sarcoidosis Sister    Rheum arthritis Sister    Heart attack Brother    Cancer Brother         unknown-finger   Diabetes Brother    Cirrhosis Brother     ALLERGIES:  has No Known Allergies.  MEDICATIONS:  Current Outpatient Medications  Medication Sig Dispense Refill   gabapentin (NEURONTIN) 300 MG capsule Take 300 mg by mouth 5 (five) times daily.     guaiFENesin (MUCINEX) 600 MG 12 hr tablet Take 600 mg by mouth 2 (two) times daily as needed.     naproxen (EC NAPROSYN) 500 MG EC tablet Take by mouth.     nortriptyline (PAMELOR) 10 MG capsule Take 20 mg by mouth at bedtime.     potassium chloride SA (KLOR-CON M) 20 MEQ tablet Take 1 tablet (20 mEq total) by mouth daily. (Patient taking differently: Take 20 mEq by mouth daily as needed.) 30 tablet 0   tiZANidine (ZANAFLEX) 4 MG tablet Take 4 mg by mouth 3 (three) times daily as needed.     torsemide 40 MG TABS Take 40 mg by mouth daily. (Patient taking differently: Take 40 mg by mouth daily as needed.) 30 tablet 0   traZODone (DESYREL) 100 MG tablet Take by mouth.     No current facility-administered medications for this visit.      PHYSICAL EXAMINATION:   Vitals:   07/12/21 1100  BP: 119/79  Pulse: 75  Resp: 16  Temp: (!) 97.5 F (36.4 C)   Filed Weights   07/12/21 1100  Weight: 139 lb 3.2 oz (63.1 kg)    Physical Exam Vitals and nursing note reviewed.  HENT:     Head: Normocephalic and atraumatic.     Mouth/Throat:     Pharynx: Oropharynx is clear.  Eyes:     Extraocular Movements: Extraocular movements intact.     Pupils: Pupils are equal, round, and reactive to light.  Cardiovascular:     Rate and Rhythm: Normal rate and regular rhythm.  Pulmonary:     Comments: Decreased breath sounds bilaterally.  Abdominal:     Palpations: Abdomen is soft.  Musculoskeletal:        General: Normal range of motion.     Cervical back: Normal range of motion.  Skin:    General: Skin is warm.  Neurological:     General: No focal deficit present.     Mental Status: She is alert and oriented to person, place, and  time.  Psychiatric:        Behavior: Behavior normal.        Judgment: Judgment normal.     LABORATORY DATA:  I have reviewed the data as listed Lab Results  Component Value Date   WBC 6.0 06/28/2021   HGB 13.3 06/28/2021   HCT 40.6 06/28/2021   MCV 85.8 06/28/2021   PLT 272 06/28/2021   Recent Labs    09/13/20 1215 09/14/20 0409 11/28/20 1941 11/28/20 2234 11/30/20 0429 06/28/21 1444  NA 137   < > 137  --  136 136  K 3.9   < >  3.9  --  3.6 4.0  CL 101   < > 104  --  103 100  CO2 28   < > 26  --  28 27  GLUCOSE 115*   < > 133*  --  106* 95  BUN 12   < > 21*  --  18 15  CREATININE 0.60   < > 0.57  --  0.55 0.61  CALCIUM 8.7*   < > 8.6*  --  8.0* 9.0  GFRNONAA >60   < > >60  --  >60 >60  PROT 6.4*  --   --  6.8  --  8.0  ALBUMIN 3.3*  --   --  3.7  --  4.1  AST 24  --   --  25  --  20  ALT 19  --   --  18  --  11  ALKPHOS 85  --   --  72  --  101  BILITOT 0.7  --   --  0.6  --  0.4  BILIDIR <0.1  --   --  <0.1  --   --   IBILI NOT CALCULATED  --   --  NOT CALCULATED  --   --    < > = values in this interval not displayed.     No results found.  Lab Results  Component Value Date   KPAFRELGTCHN 36.8 (H) 06/28/2021   LAMBDASER 21.1 06/28/2021   KAPLAMBRATIO 1.74 (H) 06/28/2021     Monoclonal gammopathy of unknown significance (MGUS) #March 2023-lambda light chain on serum immunofixation noted  Ordered by neurology in the context of her neuropathy. Patient otherwise does not have anemia; renal dysfunction hypercalcemia.  #June 2023-multiple myeloma panel negative for any M protein.;  Kappa lambda light chain ratio slightly abnormal-clinically not suspicious of any MGUS or any plasma cell dysplasia.  Would not recommend a bone marrow biopsy.  Recommend follow-up in 6 months.  #Peripheral neuropathy-on gabapentin; work-up as above.  Continue follow-up with neurology.  # Active smoker- ~1ppd- CT NOV 2022- [ER/flu]-pericardial effusion otherwise no malignancy  noted. Recommend quitting; patient not interested.  # ? Inflammatory arthritis-elevated CRP; ongoing joint pains recommend evaluation with rheumatology.  Referral made.  # DISPOSITION: # referral to [facility] re: joint pain/eelavted CRP  # follow up in late Feb 2024-  MD; 2 week prior-labs- cbc/cmp; MM panel; K/L light chains; CRP- Dr.B   All questions were answered. The patient knows to call the clinic with any problems, questions or concerns.    Cammie Sickle, MD 07/13/2021 8:36 PM

## 2021-07-12 NOTE — Progress Notes (Unsigned)
Patient denies new problems/concerns today.   °

## 2021-07-12 NOTE — Assessment & Plan Note (Signed)
#  March 2023-lambda light chain on serum immunofixation noted; ordered in the context of her neuropathy. Patient otherwise does not have anemia; renal dysfunction hypercalcemia.  #Especially given lambda light chain-in the context of neuropathy-plasma cell dyscrasia disorders have to be ruled out.   #Recommend further work-up given her ongoing-severe peripheral neuropathy-check multiple myeloma panel; kappa lambda light chain ratio; CBC CMP; iron studies CRP; B12.  Also recommend checking for Lyme disease antibodies [prior exposure to tick bites].  #Peripheral neuropathy-on gabapentin; work-up as above.  Continue follow-up with neurology.  # Active smoker- ~1ppd- CT NOV 2022- [ER/flu]-pericardial effusion otherwise no malignancy noted. Recommend quitting.   # ? Inflammatory-   # DISPOSITION: # referral to [facility] Rheumatology re: joint pain/eelavted CRP  # follow up in late Feb 2024-  MD; 2 week prior-labs- cbc/cmp; MM panel; K/L light chains; CRP- Dr.B

## 2021-07-31 ENCOUNTER — Telehealth: Payer: BC Managed Care – PPO | Admitting: Family

## 2021-07-31 ENCOUNTER — Encounter: Payer: Self-pay | Admitting: Emergency Medicine

## 2021-07-31 ENCOUNTER — Ambulatory Visit (INDEPENDENT_AMBULATORY_CARE_PROVIDER_SITE_OTHER): Payer: BC Managed Care – PPO

## 2021-07-31 ENCOUNTER — Ambulatory Visit
Admission: EM | Admit: 2021-07-31 | Discharge: 2021-07-31 | Disposition: A | Discharge status: Home / Self Care | Payer: BC Managed Care – PPO | Attending: Family Medicine | Admitting: Family Medicine

## 2021-07-31 DIAGNOSIS — R059 Cough, unspecified: Secondary | ICD-10-CM | POA: Diagnosis not present

## 2021-07-31 DIAGNOSIS — R0989 Other specified symptoms and signs involving the circulatory and respiratory systems: Secondary | ICD-10-CM | POA: Diagnosis not present

## 2021-07-31 DIAGNOSIS — J189 Pneumonia, unspecified organism: Secondary | ICD-10-CM | POA: Diagnosis not present

## 2021-07-31 MED ORDER — BENZONATATE 100 MG PO CAPS: 200.0000 mg | ORAL_CAPSULE | Freq: Three times a day (TID) | ORAL | 0 refills | Status: DC

## 2021-07-31 MED ORDER — AEROCHAMBER MV MISC: 2 refills | Status: AC

## 2021-07-31 MED ORDER — PREDNISONE 20 MG PO TABS: 60.0000 mg | ORAL_TABLET | Freq: Every day | ORAL | 0 refills | Status: AC

## 2021-07-31 MED ORDER — LEVOFLOXACIN 250 MG PO TABS: 250.0000 mg | ORAL_TABLET | Freq: Every day | ORAL | 0 refills | Status: DC

## 2021-07-31 MED ORDER — ALBUTEROL SULFATE HFA 108 (90 BASE) MCG/ACT IN AERS: 2.0000 | INHALATION_SPRAY | RESPIRATORY_TRACT | 0 refills | Status: DC | PRN

## 2021-07-31 MED ORDER — PROMETHAZINE-DM 6.25-15 MG/5ML PO SYRP
5.0000 mL | ORAL_SOLUTION | Freq: Four times a day (QID) | ORAL | 0 refills | Status: DC | PRN
Start: 2021-07-31 — End: 2022-02-22

## 2021-07-31 NOTE — Discharge Instructions (Addendum)
Take the Levaquin once daily for 7 days for treatment of your pneumonia.  Take the prednisone, 60 mg daily, starting tomorrow morning.  Take this at breakfast time.  You will take it for 5 days in a row.  This is to help decrease lung inflammation.  Use the albuterol inhaler with a spacer, 2 puffs every 4-6 hours, as needed for shortness of breath or wheezing.  Use the Tessalon Perles every 8 hours during the day as needed for cough.  Taken with a small sip of water.  These may give you numbness to the base of your tongue or metallic taste in mouth, this is normal.  Use the Promethazine DM cough syrup at bedtime for cough and congestion as it would make you drowsy.  Return for reevaluation if you have any new or worsening symptoms.  Follow-up with your primary care provider in 4 to 6 weeks for a repeat chest x-ray to ensure resolution of your pneumonia.

## 2021-07-31 NOTE — ED Provider Notes (Signed)
[facility]    CSN: [id number] Arrival date & time: 07/31/21  1305      History   Chief Complaint Chief Complaint  Patient presents with   Cough    HPI Peggy Pacheco is a 61 y.o. female.   HPI  61 year old female here for evaluation of respiratory complaints.  Patient reports that for the last 6 weeks she has been experiencing cough and chest congestion with an intermittently productive cough for yellow sputum.  Also intermittent shortness of breath and intermittent wheezing.  Yesterday she developed an elevated temp of 99.9 and then developed runny nose nasal congestion today.  She denies any sore throat or ear pain.  Past Medical History:  Diagnosis Date   Alcohol use    a. Quit 09/2020.   Diastolic dysfunction    GERD (gastroesophageal reflux disease)    Pericardial effusion    a.09/2020 Echo: EF 60-65%, no rwma, GrI DD, Nl RV fxn. Small-mod circumferential pericardial effusion. Triv TR. Mild-mod AoV sclerosis; b. 10/2020 Echo: EF 60-65%, no rwma. Mod circumferential pericardial effusion.   Tobacco abuse    a. Cutting back - 1/3-1/2 ppd.    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      Home Medications    Prior to Admission medications   Medication Sig Start Date End Date Taking? Authorizing Provider   albuterol (VENTOLIN HFA) 108 (90 Base) MCG/ACT inhaler Inhale 2 puffs into the lungs every 4 (four) hours as needed. 07/31/21  Yes Margarette Canada, NP  benzonatate (TESSALON) 100 MG capsule Take 2 capsules (200 mg total) by mouth every 8 (eight) hours. 07/31/21  Yes Margarette Canada, NP  gabapentin (NEURONTIN) 300 MG capsule Take 300 mg by mouth 5 (five) times daily. 03/15/21  Yes [provider]  levofloxacin (LEVAQUIN) 250 MG tablet Take 1 tablet (250 mg total) by mouth daily. 07/31/21  Yes Margarette Canada, NP  nortriptyline (PAMELOR) 10 MG capsule Take 20 mg by mouth at bedtime.   Yes [provider]  predniSONE (DELTASONE) 20 MG tablet Take 3 tablets (60 mg total) by mouth daily with breakfast for 5 days. 3 tablets daily for 5 days. 07/31/21 08/05/21 Yes Margarette Canada, NP  promethazine-dextromethorphan (PROMETHAZINE-DM) 6.25-15 MG/5ML syrup Take 5 mLs by mouth 4 (four) times daily as needed. 07/31/21  Yes Margarette Canada, NP  Spacer/Aero-Holding Chambers (AEROCHAMBER MV) inhaler Use as instructed 07/31/21  Yes Margarette Canada, NP  traZODone (DESYREL) 100 MG tablet Take by mouth. 05/23/21  Yes [provider]  guaiFENesin (MUCINEX) 600 MG 12 hr tablet Take 600 mg by mouth 2 (two) times daily as needed.    [provider]  naproxen (EC NAPROSYN) 500 MG EC tablet Take by mouth.    [provider]  tiZANidine (ZANAFLEX) 4 MG tablet  Take 4 mg by mouth 3 (three) times daily as needed. 06/16/21   [provider]  torsemide 40 MG TABS Take 40 mg by mouth daily. Patient taking differently: Take 40 mg by mouth daily as needed. 12/02/20   Max Sane, MD  fluticasone (FLONASE) 50 MCG/ACT nasal spray Place 2 sprays into both nostrils daily. 09/16/19 05/03/20  Melynda Ripple, MD    Family History Family History  Adopted: Yes  Problem Relation Age of Onset   Congestive Heart Failure Mother    Kidney failure Mother    Asthma Mother    Colon cancer Father    Diabetes Father     Multiple myeloma Sister    Melanoma Sister    Congestive Heart Failure Sister    Lupus Sister    Sarcoidosis Sister    Rheum arthritis Sister    Heart attack Brother    Cancer Brother        unknown-finger   Diabetes Brother    Cirrhosis Brother     Social History Social History   Tobacco Use   Smoking status: Every Day    Packs/day: 0.50    Years: 45.00    Total pack years: 22.50    Types: Cigarettes   Smokeless tobacco: Never  Vaping Use   Vaping Use: Never used  Substance Use Topics   Alcohol use: Yes    Comment: occassionally   Drug use: Yes    Types: Marijuana     Allergies   Patient has no known allergies.   Review of Systems Review of Systems  Constitutional:  Positive for fever.  HENT:  Positive for congestion and rhinorrhea. Negative for ear pain and sore throat.   Respiratory:  Positive for cough, shortness of breath and wheezing.   Hematological: Negative.   Psychiatric/Behavioral: Negative.       Physical Exam Triage Vital Signs ED Triage Vitals  Enc Vitals Group     BP 07/31/21 1352 140/83     Pulse Rate 07/31/21 1352 96     Resp 07/31/21 1352 14     Temp 07/31/21 1352 98.4 F (36.9 C)     Temp Source 07/31/21 1352 Oral     SpO2 07/31/21 1352 96 %     Weight 07/31/21 1349 139 lb (63 kg)     Height 07/31/21 1349 $RemoveBefor'5\' 4"'BagCEpvozdLJ$  (1.626 m)     Head Circumference --      Peak Flow --      Pain Score 07/31/21 1349 2     Pain Loc --      Pain Edu? --      Excl. in [place]? --    No data found.  Updated Vital Signs BP 140/83 (BP Location: Right Arm)   Pulse 96   Temp 98.4 F (36.9 C) (Oral)   Resp 14   Ht $R'5\' 4"'ud$  (1.626 m)   Wt 139 lb (63 kg)   SpO2 96%   BMI 23.86 kg/m   Visual Acuity Right Eye Distance:   Left Eye Distance:   Bilateral Distance:    Right Eye Near:   Left Eye Near:    Bilateral Near:     Physical Exam Vitals and nursing note reviewed.  Constitutional:      Appearance: Normal appearance. She is not ill-appearing.   HENT:     Head: Normocephalic and atraumatic.     Right Ear: Tympanic membrane, ear canal and external ear normal. There is no impacted cerumen.  Left Ear: Tympanic membrane, ear canal and external ear normal. There is no impacted cerumen.     Nose: Congestion and rhinorrhea present.     Mouth/Throat:     Mouth: Mucous membranes are moist.     Pharynx: Oropharynx is clear. Posterior oropharyngeal erythema present. No oropharyngeal exudate.  Cardiovascular:     Rate and Rhythm: Normal rate and regular rhythm.     Pulses: Normal pulses.     Heart sounds: Normal heart sounds. No murmur heard.    No friction rub. No gallop.  Pulmonary:     Effort: Pulmonary effort is normal.     Breath sounds: Normal breath sounds. No wheezing, rhonchi or rales.  Musculoskeletal:     Cervical back: Normal range of motion and neck supple.  Lymphadenopathy:     Cervical: No cervical adenopathy.  Skin:    General: Skin is warm and dry.     Capillary Refill: Capillary refill takes less than 2 seconds.     Findings: No erythema or rash.  Neurological:     General: No focal deficit present.     Mental Status: She is alert and oriented to person, place, and time.  Psychiatric:        Mood and Affect: Mood normal.        Behavior: Behavior normal.        Thought Content: Thought content normal.        Judgment: Judgment normal.      UC Treatments / Results  Labs (all labs ordered are listed, but only abnormal results are displayed) Labs Reviewed - No data to display  EKG   Radiology DG Chest 2 View  Result Date: 07/31/2021 CLINICAL DATA:  Ace 61 year old female presents with cough and congestion for 6 weeks. EXAM: CHEST - 2 VIEW COMPARISON:  November 30, 2020. FINDINGS: Trachea midline. Cardiomediastinal contours and hilar structures are stable. Lucency in the chest due to hyperinflation but with retrocardiac and LEFT lower lobe opacity that is new compared to prior imaging. No pneumothorax.  No sign of pleural effusion. On limited assessment no acute skeletal findings. IMPRESSION: LEFT lower lobe pneumonia. Suggest follow-up to clearing particularly given reported 6 week duration of symptoms. Electronically Signed   By: Zetta Bills M.D.   On: 07/31/2021 14:33    Procedures Procedures (including critical care time)  Medications Ordered in UC Medications - No data to display  Initial Impression / Assessment and Plan / UC Course  I have reviewed the triage vital signs and the nursing notes.  Pertinent labs & imaging results that were available during my care of the patient were reviewed by me and considered in my medical decision making (see chart for details).  Patient is a very pleasant, nontoxic-appearing 61 year old female here for evaluation of respiratory complaints as outlined in HPI above.  Physical exam reveals  Tympanic membrane's bilaterally with normal light reflex and clear external auditory canals.  Nasal mucosa is edematous and mildly erythematous with clear discharge in both nares.  Oropharyngeal exam reveals mild posterior oropharyngeal erythema and clear postnasal drip.  No cervical of adenopathy appreciated on exam.  Cardiopulmonary exam reveals S1-S2 heart sounds with regular rate and rhythm and coarse lung sounds in bilateral bases.  No definitive rails.  No rhonchi or wheezes.  Will obtain chest x-ray to look for acute cardiopulmonary process.  Chest x-ray independently reviewed and evaluated by me.  Impression: There is haziness to bilateral bases and blunting of the left costophrenic angle.  In the lateral view there does not appear to be a small effusion versus infiltrate.  Radiology overread is pending. Radiology impression states there is a left lower lobe pneumonia.  Suggest follow-up to clearing particular given the reported 6-week duration of symptoms.  I will discharge patient home with a diagnosis of left lower lobe pneumonia and treat her with  Levaquin 500 mg daily for 7 days.  Also put her on a burst dose of prednisone 60 mg daily for 5 days starting tomorrow morning.  Tessalon Perles and Promethazine DM cough syrup prescribed for cough and congestion.  I will also prescribe an albuterol inhaler that patient can use every 4-6 hours as needed for shortness of breath and wheezing.  Final Clinical Impressions(s) / UC Diagnoses   Final diagnoses:  Community acquired pneumonia of left lower lobe of lung     Discharge Instructions      Take the Levaquin once daily for 7 days for treatment of your pneumonia.  Take the prednisone, 60 mg daily, starting tomorrow morning.  Take this at breakfast time.  You will take it for 5 days in a row.  This is to help decrease lung inflammation.  Use the albuterol inhaler with a spacer, 2 puffs every 4-6 hours, as needed for shortness of breath or wheezing.  Use the Tessalon Perles every 8 hours during the day as needed for cough.  Taken with a small sip of water.  These may give you numbness to the base of your tongue or metallic taste in mouth, this is normal.  Use the Promethazine DM cough syrup at bedtime for cough and congestion as it would make you drowsy.  Return for reevaluation if you have any new or worsening symptoms.  Follow-up with your primary care provider in 4 to 6 weeks for a repeat chest x-ray to ensure resolution of your pneumonia.      ED Prescriptions     Medication Sig Dispense Auth. Provider   benzonatate (TESSALON) 100 MG capsule Take 2 capsules (200 mg total) by mouth every 8 (eight) hours. 21 capsule Margarette Canada, NP   levofloxacin (LEVAQUIN) 250 MG tablet Take 1 tablet (250 mg total) by mouth daily. 7 tablet Margarette Canada, NP   predniSONE (DELTASONE) 20 MG tablet Take 3 tablets (60 mg total) by mouth daily with breakfast for 5 days. 3 tablets daily for 5 days. 15 tablet Margarette Canada, NP   promethazine-dextromethorphan (PROMETHAZINE-DM) 6.25-15 MG/5ML syrup Take 5  mLs by mouth 4 (four) times daily as needed. 118 mL Margarette Canada, NP   albuterol (VENTOLIN HFA) 108 (90 Base) MCG/ACT inhaler Inhale 2 puffs into the lungs every 4 (four) hours as needed. 18 g Margarette Canada, NP   Spacer/Aero-Holding Chambers (AEROCHAMBER MV) inhaler Use as instructed 1 each Margarette Canada, NP      PDMP not reviewed this encounter.   Margarette Canada, NP 07/31/21 1452

## 2021-07-31 NOTE — ED Triage Notes (Signed)
Patient c/o cough and chest congestion for 6 weeks.  Patient reports fever yesterday.

## 2021-07-31 NOTE — Progress Notes (Signed)
Attempted to call patient and voicemail left. No return call. Will close visit.   Evelina Dun, FNP

## 2021-09-06 DIAGNOSIS — G629 Polyneuropathy, unspecified: Secondary | ICD-10-CM | POA: Diagnosis not present

## 2021-09-06 DIAGNOSIS — G959 Disease of spinal cord, unspecified: Secondary | ICD-10-CM | POA: Diagnosis not present

## 2021-09-08 DIAGNOSIS — R202 Paresthesia of skin: Secondary | ICD-10-CM | POA: Diagnosis not present

## 2021-09-08 DIAGNOSIS — R262 Difficulty in walking, not elsewhere classified: Secondary | ICD-10-CM | POA: Diagnosis not present

## 2021-09-08 DIAGNOSIS — G629 Polyneuropathy, unspecified: Secondary | ICD-10-CM | POA: Diagnosis not present

## 2021-09-08 DIAGNOSIS — M25619 Stiffness of unspecified shoulder, not elsewhere classified: Secondary | ICD-10-CM | POA: Diagnosis not present

## 2021-09-08 DIAGNOSIS — M25511 Pain in right shoulder: Secondary | ICD-10-CM | POA: Diagnosis not present

## 2021-09-08 DIAGNOSIS — E531 Pyridoxine deficiency: Secondary | ICD-10-CM | POA: Diagnosis not present

## 2021-09-08 DIAGNOSIS — R200 Anesthesia of skin: Secondary | ICD-10-CM | POA: Diagnosis not present

## 2021-09-08 DIAGNOSIS — R531 Weakness: Secondary | ICD-10-CM | POA: Diagnosis not present

## 2021-09-08 DIAGNOSIS — M25512 Pain in left shoulder: Secondary | ICD-10-CM | POA: Diagnosis not present

## 2021-10-06 DIAGNOSIS — J209 Acute bronchitis, unspecified: Secondary | ICD-10-CM | POA: Diagnosis not present

## 2021-10-06 DIAGNOSIS — J440 Chronic obstructive pulmonary disease with acute lower respiratory infection: Secondary | ICD-10-CM | POA: Diagnosis not present

## 2021-10-31 DIAGNOSIS — M7502 Adhesive capsulitis of left shoulder: Secondary | ICD-10-CM | POA: Diagnosis not present

## 2021-10-31 DIAGNOSIS — R531 Weakness: Secondary | ICD-10-CM | POA: Diagnosis not present

## 2021-12-13 DIAGNOSIS — G959 Disease of spinal cord, unspecified: Secondary | ICD-10-CM | POA: Diagnosis not present

## 2021-12-13 DIAGNOSIS — G629 Polyneuropathy, unspecified: Secondary | ICD-10-CM | POA: Diagnosis not present

## 2021-12-13 DIAGNOSIS — M1712 Unilateral primary osteoarthritis, left knee: Secondary | ICD-10-CM | POA: Diagnosis not present

## 2021-12-19 DIAGNOSIS — R202 Paresthesia of skin: Secondary | ICD-10-CM | POA: Diagnosis not present

## 2021-12-19 DIAGNOSIS — R200 Anesthesia of skin: Secondary | ICD-10-CM | POA: Diagnosis not present

## 2021-12-19 DIAGNOSIS — G629 Polyneuropathy, unspecified: Secondary | ICD-10-CM | POA: Diagnosis not present

## 2021-12-19 DIAGNOSIS — R262 Difficulty in walking, not elsewhere classified: Secondary | ICD-10-CM | POA: Diagnosis not present

## 2021-12-19 DIAGNOSIS — M4802 Spinal stenosis, cervical region: Secondary | ICD-10-CM | POA: Diagnosis not present

## 2021-12-19 DIAGNOSIS — R531 Weakness: Secondary | ICD-10-CM | POA: Diagnosis not present

## 2022-01-23 DIAGNOSIS — R202 Paresthesia of skin: Secondary | ICD-10-CM | POA: Diagnosis not present

## 2022-01-23 DIAGNOSIS — M5030 Other cervical disc degeneration, unspecified cervical region: Secondary | ICD-10-CM | POA: Diagnosis not present

## 2022-01-23 DIAGNOSIS — M5020 Other cervical disc displacement, unspecified cervical region: Secondary | ICD-10-CM | POA: Diagnosis not present

## 2022-01-23 DIAGNOSIS — M4802 Spinal stenosis, cervical region: Secondary | ICD-10-CM | POA: Diagnosis not present

## 2022-01-23 DIAGNOSIS — M5412 Radiculopathy, cervical region: Secondary | ICD-10-CM | POA: Diagnosis not present

## 2022-01-23 DIAGNOSIS — R200 Anesthesia of skin: Secondary | ICD-10-CM | POA: Diagnosis not present

## 2022-02-08 ENCOUNTER — Inpatient Hospital Stay: Payer: 59 | Attending: Internal Medicine

## 2022-02-08 DIAGNOSIS — Z825 Family history of asthma and other chronic lower respiratory diseases: Secondary | ICD-10-CM | POA: Diagnosis not present

## 2022-02-08 DIAGNOSIS — Z8261 Family history of arthritis: Secondary | ICD-10-CM | POA: Insufficient documentation

## 2022-02-08 DIAGNOSIS — G629 Polyneuropathy, unspecified: Secondary | ICD-10-CM | POA: Diagnosis not present

## 2022-02-08 DIAGNOSIS — D472 Monoclonal gammopathy: Secondary | ICD-10-CM | POA: Insufficient documentation

## 2022-02-08 DIAGNOSIS — Z79899 Other long term (current) drug therapy: Secondary | ICD-10-CM | POA: Insufficient documentation

## 2022-02-08 DIAGNOSIS — M549 Dorsalgia, unspecified: Secondary | ICD-10-CM | POA: Insufficient documentation

## 2022-02-08 DIAGNOSIS — Z8379 Family history of other diseases of the digestive system: Secondary | ICD-10-CM | POA: Insufficient documentation

## 2022-02-08 DIAGNOSIS — M2550 Pain in unspecified joint: Secondary | ICD-10-CM | POA: Diagnosis not present

## 2022-02-08 DIAGNOSIS — F17210 Nicotine dependence, cigarettes, uncomplicated: Secondary | ICD-10-CM | POA: Insufficient documentation

## 2022-02-08 DIAGNOSIS — Z841 Family history of disorders of kidney and ureter: Secondary | ICD-10-CM | POA: Insufficient documentation

## 2022-02-08 DIAGNOSIS — Z808 Family history of malignant neoplasm of other organs or systems: Secondary | ICD-10-CM | POA: Insufficient documentation

## 2022-02-08 DIAGNOSIS — F1290 Cannabis use, unspecified, uncomplicated: Secondary | ICD-10-CM | POA: Insufficient documentation

## 2022-02-08 DIAGNOSIS — Z8249 Family history of ischemic heart disease and other diseases of the circulatory system: Secondary | ICD-10-CM | POA: Insufficient documentation

## 2022-02-08 DIAGNOSIS — R5383 Other fatigue: Secondary | ICD-10-CM | POA: Diagnosis not present

## 2022-02-08 DIAGNOSIS — Z833 Family history of diabetes mellitus: Secondary | ICD-10-CM | POA: Insufficient documentation

## 2022-02-08 DIAGNOSIS — G8929 Other chronic pain: Secondary | ICD-10-CM | POA: Insufficient documentation

## 2022-02-08 DIAGNOSIS — Z800 Family history of malignant neoplasm of digestive organs: Secondary | ICD-10-CM | POA: Diagnosis not present

## 2022-02-08 LAB — CBC WITH DIFFERENTIAL/PLATELET
Abs Immature Granulocytes: 0.05 10*3/uL (ref 0.00–0.07)
Basophils Absolute: 0 10*3/uL (ref 0.0–0.1)
Basophils Relative: 1 %
Eosinophils Absolute: 0.4 10*3/uL (ref 0.0–0.5)
Eosinophils Relative: 5 %
HCT: 40.5 % (ref 36.0–46.0)
Hemoglobin: 12.6 g/dL (ref 12.0–15.0)
Immature Granulocytes: 1 %
Lymphocytes Relative: 14 %
Lymphs Abs: 1 10*3/uL (ref 0.7–4.0)
MCH: 26.5 pg (ref 26.0–34.0)
MCHC: 31.1 g/dL (ref 30.0–36.0)
MCV: 85.3 fL (ref 80.0–100.0)
Monocytes Absolute: 1 10*3/uL (ref 0.1–1.0)
Monocytes Relative: 14 %
Neutro Abs: 4.5 10*3/uL (ref 1.7–7.7)
Neutrophils Relative %: 65 %
Platelets: 278 10*3/uL (ref 150–400)
RBC: 4.75 MIL/uL (ref 3.87–5.11)
RDW: 14.8 % (ref 11.5–15.5)
WBC: 6.9 10*3/uL (ref 4.0–10.5)
nRBC: 0 % (ref 0.0–0.2)

## 2022-02-08 LAB — COMPREHENSIVE METABOLIC PANEL
ALT: 18 U/L (ref 0–44)
AST: 27 U/L (ref 15–41)
Albumin: 3.9 g/dL (ref 3.5–5.0)
Alkaline Phosphatase: 154 U/L — ABNORMAL HIGH (ref 38–126)
Anion gap: 10 (ref 5–15)
BUN: 19 mg/dL (ref 6–20)
CO2: 31 mmol/L (ref 22–32)
Calcium: 9.2 mg/dL (ref 8.9–10.3)
Chloride: 97 mmol/L — ABNORMAL LOW (ref 98–111)
Creatinine, Ser: 0.56 mg/dL (ref 0.44–1.00)
GFR, Estimated: >60 mL/min (ref >60)
Glucose, Bld: 116 mg/dL — ABNORMAL HIGH (ref 70–99)
Potassium: 3.8 mmol/L (ref 3.5–5.1)
Sodium: 138 mmol/L (ref 135–145)
Total Bilirubin: 0.2 mg/dL — ABNORMAL LOW (ref 0.3–1.2)
Total Protein: 8.2 g/dL — ABNORMAL HIGH (ref 6.5–8.1)

## 2022-02-08 LAB — C-REACTIVE PROTEIN: CRP: 4.4 mg/dL — ABNORMAL HIGH (ref <1.0)

## 2022-02-09 LAB — KAPPA/LAMBDA LIGHT CHAINS
Kappa free light chain: 34.2 mg/L — ABNORMAL HIGH (ref 3.3–19.4)
Kappa, lambda light chain ratio: 1.48 (ref 0.26–1.65)
Lambda free light chains: 23.1 mg/L (ref 5.7–26.3)

## 2022-02-15 LAB — MULTIPLE MYELOMA PANEL, SERUM
Albumin SerPl Elph-Mcnc: 3.6 g/dL (ref 2.9–4.4)
Albumin/Glob SerPl: 1 (ref 0.7–1.7)
Alpha 1: 0.4 g/dL (ref 0.0–0.4)
Alpha2 Glob SerPl Elph-Mcnc: 1 g/dL (ref 0.4–1.0)
B-Globulin SerPl Elph-Mcnc: 1.1 g/dL (ref 0.7–1.3)
Gamma Glob SerPl Elph-Mcnc: 1.5 g/dL (ref 0.4–1.8)
Globulin, Total: 4 g/dL — ABNORMAL HIGH (ref 2.2–3.9)
IgA: 344 mg/dL (ref 87–352)
IgG (Immunoglobin G), Serum: 1408 mg/dL (ref 586–1602)
IgM (Immunoglobulin M), Srm: 209 mg/dL (ref 26–217)
Total Protein ELP: 7.6 g/dL (ref 6.0–8.5)

## 2022-02-20 DIAGNOSIS — R69 Illness, unspecified: Secondary | ICD-10-CM | POA: Diagnosis not present

## 2022-02-22 ENCOUNTER — Encounter: Payer: Self-pay | Admitting: Internal Medicine

## 2022-02-22 ENCOUNTER — Inpatient Hospital Stay: Payer: 59 | Admitting: Internal Medicine

## 2022-02-22 DIAGNOSIS — R5383 Other fatigue: Secondary | ICD-10-CM | POA: Diagnosis not present

## 2022-02-22 DIAGNOSIS — Z79899 Other long term (current) drug therapy: Secondary | ICD-10-CM | POA: Diagnosis not present

## 2022-02-22 DIAGNOSIS — Z825 Family history of asthma and other chronic lower respiratory diseases: Secondary | ICD-10-CM | POA: Diagnosis not present

## 2022-02-22 DIAGNOSIS — Z808 Family history of malignant neoplasm of other organs or systems: Secondary | ICD-10-CM | POA: Diagnosis not present

## 2022-02-22 DIAGNOSIS — Z8249 Family history of ischemic heart disease and other diseases of the circulatory system: Secondary | ICD-10-CM | POA: Diagnosis not present

## 2022-02-22 DIAGNOSIS — Z800 Family history of malignant neoplasm of digestive organs: Secondary | ICD-10-CM | POA: Diagnosis not present

## 2022-02-22 DIAGNOSIS — Z8379 Family history of other diseases of the digestive system: Secondary | ICD-10-CM | POA: Diagnosis not present

## 2022-02-22 DIAGNOSIS — Z833 Family history of diabetes mellitus: Secondary | ICD-10-CM | POA: Diagnosis not present

## 2022-02-22 DIAGNOSIS — M2550 Pain in unspecified joint: Secondary | ICD-10-CM | POA: Diagnosis not present

## 2022-02-22 DIAGNOSIS — M549 Dorsalgia, unspecified: Secondary | ICD-10-CM | POA: Diagnosis not present

## 2022-02-22 DIAGNOSIS — G629 Polyneuropathy, unspecified: Secondary | ICD-10-CM | POA: Diagnosis not present

## 2022-02-22 DIAGNOSIS — F1290 Cannabis use, unspecified, uncomplicated: Secondary | ICD-10-CM | POA: Diagnosis not present

## 2022-02-22 DIAGNOSIS — F17210 Nicotine dependence, cigarettes, uncomplicated: Secondary | ICD-10-CM | POA: Diagnosis not present

## 2022-02-22 DIAGNOSIS — D472 Monoclonal gammopathy: Secondary | ICD-10-CM

## 2022-02-22 DIAGNOSIS — Z8261 Family history of arthritis: Secondary | ICD-10-CM | POA: Diagnosis not present

## 2022-02-22 DIAGNOSIS — G8929 Other chronic pain: Secondary | ICD-10-CM | POA: Diagnosis not present

## 2022-02-22 DIAGNOSIS — Z841 Family history of disorders of kidney and ureter: Secondary | ICD-10-CM | POA: Diagnosis not present

## 2022-02-22 NOTE — Progress Notes (Signed)
[facility] CONSULT NOTE  Patient Care Team: [facility], [facility] as PCP - General Wellington Hampshire, MD as PCP - Cardiology (Cardiology) Cammie Sickle, MD as Consulting Physician (Oncology)  CHIEF COMPLAINTS/PURPOSE OF CONSULTATION: Monoclonal gammopathy  HEMATOLOGY HISTORY  # MARCH 2023- IFE Serum [Dr.Potter]There is a region of constricted electrophoretic mobility in lambda light chain, which is of unclear significance.   #Severe peripheral neuropathy [s/p flu-nov 2022]  HISTORY OF PRESENTING ILLNESS: Ambulating independently.  Accompanied by her sister.  Peggy Pacheco 62 y.o.  female is here to review the results of the blood work ordered for MGUS.  Patient continues to have chronic joint pains back pain.  Patient has had multiple evaluations with rheumatologist; neurologist and also physiatrist.  Unfortunately she continues to smoke.Resp issues takes inhalers as needed. Complains of ongoing fatigue.  Denies dizziness or lightheadedness.   Patient continues to have worsening tingling and numbness in extremities.     Review of Systems  Constitutional:  Positive for malaise/fatigue and weight loss. Negative for chills, diaphoresis and fever.  HENT:  Negative for nosebleeds and sore throat.   Eyes:  Negative for double vision.  Respiratory:  Negative for cough, hemoptysis, sputum production, shortness of breath and wheezing.   Cardiovascular:  Negative for chest pain, palpitations, orthopnea and leg swelling.  Gastrointestinal:  Negative for abdominal pain, blood in stool, constipation, diarrhea, heartburn, melena, nausea and vomiting.  Genitourinary:  Negative for dysuria, frequency and urgency.  Musculoskeletal:  Positive for back pain and joint pain.  Skin: Negative.  Negative for itching and rash.  Neurological:  Positive for tingling, tremors, sensory change and weakness. Negative for dizziness, focal weakness and headaches.  Endo/Heme/Allergies:   Does not bruise/bleed easily.  Psychiatric/Behavioral:  Negative for depression. The patient is not nervous/anxious and does not have insomnia.     MEDICAL HISTORY:  Past Medical History:  Diagnosis Date   Alcohol use    a. Quit 09/2020.   Diastolic dysfunction    GERD (gastroesophageal reflux disease)    Pericardial effusion    a.09/2020 Echo: EF 60-65%, no rwma, GrI DD, Nl RV fxn. Small-mod circumferential pericardial effusion. Triv TR. Mild-mod AoV sclerosis; b. 10/2020 Echo: EF 60-65%, no rwma. Mod circumferential pericardial effusion.   Tobacco abuse    a. Cutting back - 1/3-1/2 ppd.    SURGICAL HISTORY: Past Surgical History:  Procedure Laterality Date   NO PAST SURGERIES      SOCIAL HISTORY: Social History   Socioeconomic History   Marital status: Widowed    Spouse name: Not on file   Number of children: Not on file   Years of education: Not on file   Highest education level: Not on file  Occupational History   Not on file  Tobacco Use   Smoking status: Every Day    Packs/day: 0.50    Years: 45.00    Total pack years: 22.50    Types: Cigarettes   Smokeless tobacco: Never  Vaping Use   Vaping Use: Never used  Substance and Sexual Activity   Alcohol use: Yes    Comment: occassionally   Drug use: Yes    Types: Marijuana   Sexual activity: Not Currently  Other Topics Concern   Not on file  Social History Narrative   Patient lives in [place].  Active smoker.  Used to work in a [employer]; unable to [employer] December 2022.    Social Determinants of Health   Financial Resource Strain: Not  on file  Food Insecurity: Not on file  Transportation Needs: Not on file  Physical Activity: Not on file  Stress: Not on file  Social Connections: Not on file  Intimate Partner Violence: Not on file    FAMILY HISTORY: Family History  Adopted: Yes  Problem Relation Age of Onset   Congestive Heart Failure Mother    Kidney failure Mother    Asthma Mother     Colon cancer Father    Diabetes Father    Multiple myeloma Sister    Melanoma Sister    Congestive Heart Failure Sister    Lupus Sister    Sarcoidosis Sister    Rheum arthritis Sister    Heart attack Brother    Cancer Brother        unknown-finger   Diabetes Brother    Cirrhosis Brother     ALLERGIES:  has No Known Allergies.  MEDICATIONS:  Current Outpatient Medications  Medication Sig Dispense Refill   albuterol (VENTOLIN HFA) 108 (90 Base) MCG/ACT inhaler Inhale 2 puffs into the lungs every 4 (four) hours as needed. 18 g 0   celecoxib (CELEBREX) 100 MG capsule Take 100 mg by mouth 2 (two) times daily.     gabapentin (NEURONTIN) 300 MG capsule Take 300 mg by mouth 5 (five) times daily.     nortriptyline (PAMELOR) 10 MG capsule Take 20 mg by mouth at bedtime.     Spacer/Aero-Holding Chambers (AEROCHAMBER MV) inhaler Use as instructed 1 each 2   traZODone (DESYREL) 100 MG tablet Take by mouth.     torsemide 40 MG TABS Take 40 mg by mouth daily. (Patient not taking: Reported on 02/22/2022) 30 tablet 0   No current facility-administered medications for this visit.      PHYSICAL EXAMINATION:   Vitals:   02/22/22 1112  BP: 132/77  Pulse: 81  Resp: 18  Temp: 98.1 F (36.7 C)  SpO2: 100%   Filed Weights   02/22/22 1112  Weight: 158 lb 9.6 oz (71.9 kg)    Physical Exam Vitals and nursing note reviewed.  HENT:     Head: Normocephalic and atraumatic.     Mouth/Throat:     Pharynx: Oropharynx is clear.  Eyes:     Extraocular Movements: Extraocular movements intact.     Pupils: Pupils are equal, round, and reactive to light.  Cardiovascular:     Rate and Rhythm: Normal rate and regular rhythm.  Pulmonary:     Comments: Decreased breath sounds bilaterally.  Abdominal:     Palpations: Abdomen is soft.  Musculoskeletal:        General: Normal range of motion.     Cervical back: Normal range of motion.  Skin:    General: Skin is warm.  Neurological:      General: No focal deficit present.     Mental Status: She is alert and oriented to person, place, and time.  Psychiatric:        Behavior: Behavior normal.        Judgment: Judgment normal.     LABORATORY DATA:  I have reviewed the data as listed Lab Results  Component Value Date   WBC 6.9 02/08/2022   HGB 12.6 02/08/2022   HCT 40.5 02/08/2022   MCV 85.3 02/08/2022   PLT 278 02/08/2022   Recent Labs    06/28/21 1444 02/08/22 0802  NA 136 138  K 4.0 3.8  CL 100 97*  CO2 27 31  GLUCOSE 95 116*  BUN 15  19  CREATININE 0.61 0.56  CALCIUM 9.0 9.2  GFRNONAA >60 >60  PROT 8.0 8.2*  ALBUMIN 4.1 3.9  AST 20 27  ALT 11 18  ALKPHOS 101 154*  BILITOT 0.4 0.2*     No results found.  Lab Results  Component Value Date   KPAFRELGTCHN 34.2 (H) 02/08/2022   KPAFRELGTCHN 36.8 (H) 06/28/2021   LAMBDASER 23.1 02/08/2022   LAMBDASER 21.1 06/28/2021   KAPLAMBRATIO 1.48 02/08/2022   KAPLAMBRATIO 1.74 (H) 06/28/2021     Monoclonal gammopathy of unknown significance (MGUS) #March 2023-lambda light chain on serum immunofixation noted  Ordered by neurology in the context of her neuropathy. Patient otherwise does not have anemia; renal dysfunction hypercalcemia. FEB 2024- NEG Immuno-fixation; k/L= ratio= normal.  Would not recommend a bone marrow biopsy.  I do not think patient has MGUS or any form of myeloma.  Patient did not have skeletal survey done.   # Peripheral neuropathy-on gabapentin; work-up as above.  Continue follow-up with neurology.  # Active smoker- ~1ppd- CT NOV 2022- [ER/flu]-pericardial effusion otherwise no malignancy noted. Recommend quitting; patient not interested.  # ? Inflammatory arthritis-elevated CRP; s/p  evaluation with rheumatology; no diagnosis made.   # DISPOSITION: # follow up as needed-  Dr.B   All questions were answered. The patient knows to call the clinic with any problems, questions or concerns.    Cammie Sickle, MD 02/22/2022 12:57  PM

## 2022-02-22 NOTE — Progress Notes (Signed)
Chronic hand and foot pain. Energy level is low. Seasonal Depression. Appetite is down. Eats one meal a day. Abdomen is tight, she stopped taking torsemide because it wasn't working. Denies dizziness or lightheadedness. Resp issues takes inhalers as needed.Marland Kitchen

## 2022-02-22 NOTE — Assessment & Plan Note (Addendum)
#  March 2023-lambda light chain on serum immunofixation noted  Ordered by neurology in the context of her neuropathy. Patient otherwise does not have anemia; renal dysfunction hypercalcemia. FEB 2024- NEG Immuno-fixation; k/L= ratio= normal.  Would not recommend a bone marrow biopsy.  I do not think patient has MGUS or any form of myeloma.  Patient did not have skeletal survey done.   # Peripheral neuropathy-on gabapentin; work-up as above.  Continue follow-up with neurology.  # Active smoker- ~1ppd- CT NOV 2022- [ER/flu]-pericardial effusion otherwise no malignancy noted. Recommend quitting; patient not interested.  # ? Inflammatory arthritis-elevated CRP; s/p  evaluation with rheumatology; no diagnosis made.   # DISPOSITION: # follow up as needed-  Dr.B

## 2022-02-24 DIAGNOSIS — M5416 Radiculopathy, lumbar region: Secondary | ICD-10-CM | POA: Diagnosis not present

## 2022-02-24 DIAGNOSIS — M4802 Spinal stenosis, cervical region: Secondary | ICD-10-CM | POA: Diagnosis not present

## 2022-02-24 DIAGNOSIS — M5136 Other intervertebral disc degeneration, lumbar region: Secondary | ICD-10-CM | POA: Diagnosis not present

## 2022-02-24 DIAGNOSIS — M5412 Radiculopathy, cervical region: Secondary | ICD-10-CM | POA: Diagnosis not present

## 2022-03-20 DIAGNOSIS — G621 Alcoholic polyneuropathy: Secondary | ICD-10-CM | POA: Diagnosis not present

## 2022-03-21 DIAGNOSIS — R531 Weakness: Secondary | ICD-10-CM | POA: Diagnosis not present

## 2022-03-21 DIAGNOSIS — R202 Paresthesia of skin: Secondary | ICD-10-CM | POA: Diagnosis not present

## 2022-03-21 DIAGNOSIS — R200 Anesthesia of skin: Secondary | ICD-10-CM | POA: Diagnosis not present

## 2022-03-21 DIAGNOSIS — R262 Difficulty in walking, not elsewhere classified: Secondary | ICD-10-CM | POA: Diagnosis not present

## 2022-03-29 DIAGNOSIS — H268 Other specified cataract: Secondary | ICD-10-CM | POA: Diagnosis not present

## 2022-03-29 DIAGNOSIS — H2512 Age-related nuclear cataract, left eye: Secondary | ICD-10-CM | POA: Diagnosis not present

## 2022-04-05 DIAGNOSIS — M174 Other bilateral secondary osteoarthritis of knee: Secondary | ICD-10-CM | POA: Diagnosis not present

## 2022-04-05 DIAGNOSIS — M25561 Pain in right knee: Secondary | ICD-10-CM | POA: Diagnosis not present

## 2022-04-05 DIAGNOSIS — M25562 Pain in left knee: Secondary | ICD-10-CM | POA: Diagnosis not present

## 2022-04-06 DIAGNOSIS — R0602 Shortness of breath: Secondary | ICD-10-CM | POA: Diagnosis not present

## 2022-04-06 DIAGNOSIS — L539 Erythematous condition, unspecified: Secondary | ICD-10-CM | POA: Diagnosis not present

## 2022-04-06 DIAGNOSIS — R600 Localized edema: Secondary | ICD-10-CM | POA: Diagnosis not present

## 2022-04-12 DIAGNOSIS — H2511 Age-related nuclear cataract, right eye: Secondary | ICD-10-CM | POA: Diagnosis not present

## 2022-05-02 DIAGNOSIS — M4802 Spinal stenosis, cervical region: Secondary | ICD-10-CM | POA: Diagnosis not present

## 2022-05-02 DIAGNOSIS — R200 Anesthesia of skin: Secondary | ICD-10-CM | POA: Diagnosis not present

## 2022-05-02 DIAGNOSIS — R202 Paresthesia of skin: Secondary | ICD-10-CM | POA: Diagnosis not present

## 2022-05-02 DIAGNOSIS — R262 Difficulty in walking, not elsewhere classified: Secondary | ICD-10-CM | POA: Diagnosis not present

## 2022-05-02 DIAGNOSIS — E531 Pyridoxine deficiency: Secondary | ICD-10-CM | POA: Diagnosis not present

## 2022-05-18 ENCOUNTER — Encounter: Payer: Self-pay | Admitting: Ophthalmology

## 2022-05-18 NOTE — Anesthesia Preprocedure Evaluation (Addendum)
Anesthesia Evaluation  Patient identified by MRN, date of birth, ID band Patient awake    Reviewed: Allergy & Precautions, NPO status , Patient's Chart, lab work & pertinent test results  Airway Mallampati: III  TM Distance: >3 FB Neck ROM: Full    Dental  (+) Poor Dentition, Missing Missing right upper central incisor, several capped or veneer, none loose, other missing:   Pulmonary asthma , COPD, Current Smoker   Pulmonary exam normal breath sounds clear to auscultation       Cardiovascular hypertension, Normal cardiovascular exam Rhythm:Regular Rate:Normal  11-29-20 EF 55% with grade I diastolic dysfunction But December 22, no mention of grade I diastolic dysfunction   Neuro/Psych  Neuromuscular disease    GI/Hepatic ,GERD  ,,  Endo/Other    Renal/GU      Musculoskeletal   Abdominal   Peds  Hematology   Anesthesia Other Findings GERD (gastroesophageal reflux disease) Alcohol use Tobacco abuse  Pericardial effusion Diastolic dysfunction  Asthma Alcoholic polyneuropathy    Reproductive/Obstetrics                             Anesthesia Physical Anesthesia Plan  ASA: 3  Anesthesia Plan: MAC   Post-op Pain Management:    Induction: Intravenous  PONV Risk Score and Plan:   Airway Management Planned: Natural Airway and Nasal Cannula  Additional Equipment:   Intra-op Plan:   Post-operative Plan:   Informed Consent: I have reviewed the patients History and Physical, chart, labs and discussed the procedure including the risks, benefits and alternatives for the proposed anesthesia with the patient or authorized representative who has indicated his/her understanding and acceptance.     Dental Advisory Given  Plan Discussed with: Anesthesiologist, CRNA and Surgeon  Anesthesia Plan Comments: (Patient consented for risks of anesthesia including but not limited to:  - adverse  reactions to medications - damage to eyes, teeth, lips or other oral mucosa - nerve damage due to positioning  - sore throat or hoarseness - Damage to heart, brain, nerves, lungs, other parts of body or loss of life  Patient voiced understanding.)       Anesthesia Quick Evaluation

## 2022-05-25 ENCOUNTER — Ambulatory Visit: Payer: 59 | Admitting: Anesthesiology

## 2022-05-25 ENCOUNTER — Ambulatory Visit
Admission: RE | Admit: 2022-05-25 | Discharge: 2022-05-25 | Disposition: A | Discharge status: Home / Self Care | Payer: 59 | Attending: Ophthalmology | Admitting: Ophthalmology

## 2022-05-25 ENCOUNTER — Encounter
Admission: RE | Disposition: A | Discharge status: Home / Self Care | Payer: Self-pay | Source: Home / Self Care | Attending: Ophthalmology

## 2022-05-25 ENCOUNTER — Other Ambulatory Visit: Payer: Self-pay

## 2022-05-25 ENCOUNTER — Encounter: Payer: Self-pay | Admitting: Ophthalmology

## 2022-05-25 DIAGNOSIS — G709 Myoneural disorder, unspecified: Secondary | ICD-10-CM | POA: Insufficient documentation

## 2022-05-25 DIAGNOSIS — I10 Essential (primary) hypertension: Secondary | ICD-10-CM | POA: Diagnosis not present

## 2022-05-25 DIAGNOSIS — K219 Gastro-esophageal reflux disease without esophagitis: Secondary | ICD-10-CM | POA: Insufficient documentation

## 2022-05-25 DIAGNOSIS — H2589 Other age-related cataract: Secondary | ICD-10-CM | POA: Diagnosis not present

## 2022-05-25 DIAGNOSIS — J449 Chronic obstructive pulmonary disease, unspecified: Secondary | ICD-10-CM | POA: Insufficient documentation

## 2022-05-25 DIAGNOSIS — Z79899 Other long term (current) drug therapy: Secondary | ICD-10-CM | POA: Diagnosis not present

## 2022-05-25 DIAGNOSIS — F17210 Nicotine dependence, cigarettes, uncomplicated: Secondary | ICD-10-CM | POA: Diagnosis not present

## 2022-05-25 DIAGNOSIS — H269 Unspecified cataract: Secondary | ICD-10-CM | POA: Diagnosis not present

## 2022-05-25 DIAGNOSIS — H2511 Age-related nuclear cataract, right eye: Secondary | ICD-10-CM | POA: Insufficient documentation

## 2022-05-25 HISTORY — DX: Monoclonal gammopathy: D47.2

## 2022-05-25 HISTORY — DX: Alcoholic polyneuropathy: G62.1

## 2022-05-25 HISTORY — PX: CATARACT EXTRACTION W/PHACO: SHX586

## 2022-05-25 HISTORY — DX: Unspecified asthma, uncomplicated: J45.909

## 2022-05-25 SURGERY — PHACOEMULSIFICATION, CATARACT, WITH IOL INSERTION
Anesthesia: Monitor Anesthesia Care | Site: Eye | Laterality: Right

## 2022-05-25 MED ORDER — PROPOFOL 10 MG/ML IV BOLUS
INTRAVENOUS | Status: DC | PRN
  Administered 2022-05-25: 50 mg via INTRAVENOUS

## 2022-05-25 MED ORDER — LIDOCAINE HCL (PF) 2 % IJ SOLN
INTRAOCULAR | Status: DC | PRN
  Administered 2022-05-25: 1 mL via INTRAOCULAR

## 2022-05-25 MED ORDER — MIDAZOLAM HCL 5 MG/5ML IJ SOLN
INTRAMUSCULAR | Status: DC | PRN
  Administered 2022-05-25: 1 mg via INTRAVENOUS

## 2022-05-25 MED ORDER — MOXIFLOXACIN HCL 0.5 % OP SOLN
OPHTHALMIC | Status: DC | PRN
  Administered 2022-05-25: .2 mL via OPHTHALMIC

## 2022-05-25 MED ORDER — TRYPAN BLUE 0.06 % IO SOSY
PREFILLED_SYRINGE | INTRAOCULAR | Status: DC | PRN
  Administered 2022-05-25: .2 mL via INTRAOCULAR

## 2022-05-25 MED ORDER — SIGHTPATH DOSE#1 BSS IO SOLN
INTRAOCULAR | Status: DC | PRN
  Administered 2022-05-25 (×2): 15 mL

## 2022-05-25 MED ORDER — SIGHTPATH DOSE#1 NA HYALUR & NA CHOND-NA HYALUR IO KIT
PACK | INTRAOCULAR | Status: DC | PRN
  Administered 2022-05-25: 1 via OPHTHALMIC

## 2022-05-25 MED ORDER — SIGHTPATH DOSE#1 BSS IO SOLN
INTRAOCULAR | Status: DC | PRN
  Administered 2022-05-25: 118 mL via OPHTHALMIC

## 2022-05-25 MED ORDER — LACTATED RINGERS IV SOLN: INTRAVENOUS | Status: DC

## 2022-05-25 MED ORDER — SODIUM HYALURONATE 23MG/ML IO SOSY
PREFILLED_SYRINGE | INTRAOCULAR | Status: DC | PRN
  Administered 2022-05-25: .6 mL via INTRAOCULAR

## 2022-05-25 MED ORDER — LIDOCAINE HCL 2 % IJ SOLN
INTRAMUSCULAR | Status: DC | PRN
  Administered 2022-05-25: 3 mL via OPHTHALMIC

## 2022-05-25 MED ORDER — ARMC OPHTHALMIC DILATING DROPS
1.0000 | OPHTHALMIC | Status: DC | PRN
  Administered 2022-05-25 (×3): 1 via OPHTHALMIC

## 2022-05-25 MED ORDER — BRIMONIDINE TARTRATE-TIMOLOL 0.2-0.5 % OP SOLN
OPHTHALMIC | Status: DC | PRN
  Administered 2022-05-25: 1 [drp] via OPHTHALMIC

## 2022-05-25 MED ORDER — FENTANYL CITRATE (PF) 100 MCG/2ML IJ SOLN
INTRAMUSCULAR | Status: DC | PRN
  Administered 2022-05-25: 50 ug via INTRAVENOUS

## 2022-05-25 MED ORDER — TETRACAINE HCL 0.5 % OP SOLN
1.0000 [drp] | OPHTHALMIC | Status: DC | PRN
  Administered 2022-05-25 (×3): 1 [drp] via OPHTHALMIC

## 2022-05-25 SURGICAL SUPPLY — 14 items
CANNULA ANT/CHMB 27G (MISCELLANEOUS) IMPLANT
CANNULA ANT/CHMB 27GA (MISCELLANEOUS) ×1 IMPLANT
CATARACT SUITE SIGHTPATH (MISCELLANEOUS) ×1 IMPLANT
DISSECTOR HYDRO NUCLEUS 50X22 (MISCELLANEOUS) ×1 IMPLANT
DRSG TEGADERM 2-3/8X2-3/4 SM (GAUZE/BANDAGES/DRESSINGS) ×1 IMPLANT
FEE CATARACT SUITE SIGHTPATH (MISCELLANEOUS) ×1 IMPLANT
GLOVE SURG SYN 7.5  E (GLOVE) ×1
GLOVE SURG SYN 7.5 E (GLOVE) ×1 IMPLANT
GLOVE SURG SYN 7.5 PF PI (GLOVE) ×1 IMPLANT
GLOVE SURG SYN 8.5  E (GLOVE) ×1
GLOVE SURG SYN 8.5 E (GLOVE) ×1 IMPLANT
GLOVE SURG SYN 8.5 PF PI (GLOVE) ×1 IMPLANT
LENS IOL TECNIS EYHANCE 19.5 (Intraocular Lens) IMPLANT
NDL RETROBULBAR 25GX1.5 STRL (NEEDLE) IMPLANT

## 2022-05-25 NOTE — H&P (Signed)
[facility]   Primary Care Physician:  Carlena Sax, MD Ophthalmologist: Dr. Deberah Pelton  Pre-Procedure History & Physical: HPI:  Peggy Pacheco is a 62 y.o. female here for cataract surgery.   Past Medical History:  Diagnosis Date   Alcohol use    a. Quit 09/2020.   Alcoholic polyneuropathy (HCC)    Asthma    Diastolic dysfunction    GERD (gastroesophageal reflux disease)    Monoclonal gammopathy of unknown significance    Pericardial effusion    a.09/2020 Echo: EF 60-65%, no rwma, GrI DD, Nl RV fxn. Small-mod circumferential pericardial effusion. Triv TR. Mild-mod AoV sclerosis; b. 10/2020 Echo: EF 60-65%, no rwma. Mod circumferential pericardial effusion.   Tobacco abuse    a. Cutting back - 1/3-1/2 ppd.    Past Surgical History:  Procedure Laterality Date   NO PAST SURGERIES      Prior to Admission medications   Medication Sig Start Date End Date Taking? Authorizing Provider  albuterol (VENTOLIN HFA) 108 (90 Base) MCG/ACT inhaler Inhale 2 puffs into the lungs every 4 (four) hours as needed. 07/31/21  Yes Becky Augusta, NP  celecoxib (CELEBREX) 100 MG capsule Take 200 mg by mouth 2 (two) times daily. 01/26/22  Yes [provider]  gabapentin (NEURONTIN) 300 MG capsule Take 600 mg by mouth 5 (five) times daily. 03/15/21  Yes [provider]  nortriptyline (PAMELOR) 10 MG capsule Take 20 mg by mouth 2 (two) times daily.   Yes [provider]  traZODone (DESYREL) 100 MG tablet Take by mouth. 05/23/21  Yes [provider]  Spacer/Aero-Holding Chambers (AEROCHAMBER MV) inhaler Use as instructed Patient not taking: Reported on 05/18/2022 07/31/21   Becky Augusta, NP  torsemide 40 MG TABS Take 40 mg by mouth daily. Patient not taking: Reported on 02/22/2022 12/02/20   Delfino Lovett, MD  fluticasone [facility]) 50 MCG/ACT nasal spray Place 2 sprays into both nostrils daily. 09/16/19 05/03/20  Domenick Gong, MD    Allergies as of 04/13/2022   (No  Known Allergies)    Family History  Adopted: Yes  Problem Relation Age of Onset   Congestive Heart Failure Mother    Kidney failure Mother    Asthma Mother    Colon cancer Father    Diabetes Father    Multiple myeloma Sister    Melanoma Sister    Congestive Heart Failure Sister    Lupus Sister    Sarcoidosis Sister    Rheum arthritis Sister    Heart attack Brother    Cancer Brother        unknown-finger   Diabetes Brother    Cirrhosis Brother     Social History   Socioeconomic History   Marital status: Widowed    Spouse name: Not on file   Number of children: Not on file   Years of education: Not on file   Highest education level: Not on file  Occupational History   Not on file  Tobacco Use   Smoking status: Every Day    Packs/day: 0.50    Years: 45.00    Additional pack years: 0.00    Total pack years: 22.50    Types: Cigarettes   Smokeless tobacco: Never  Vaping Use   Vaping Use: Never used  Substance and Sexual Activity   Alcohol use: Yes    Comment: occassionally   Drug use: Yes    Types: Marijuana   Sexual activity: Not Currently  Other Topics Concern  Not on file  Social History Narrative   Patient lives in [place].  Active smoker.  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    Social Determinants of Health   Financial Resource Strain: Not on file  Food Insecurity: Not on file  Transportation Needs: Not on file  Physical Activity: Not on file  Stress: Not on file  Social Connections: Not on file  Intimate Partner Violence: Not on file    Review of Systems: See HPI, otherwise negative ROS  Physical Exam: Ht 5\' 4"  (1.626 m)   Wt 70.8 kg   BMI 26.78 kg/m  General:   Alert, cooperative in NAD Head:  Normocephalic and atraumatic. Respiratory:  Normal work of breathing. Cardiovascular:  RRR  Impression/Plan: Peggy Pacheco is here for cataract surgery.  Risks, benefits, limitations, and alternatives  regarding cataract surgery have been reviewed with the patient.  Questions have been answered.  All parties agreeable.   Estanislado Pandy, MD  05/25/2022, 11:30 AM

## 2022-05-25 NOTE — Transfer of Care (Signed)
Immediate Anesthesia Transfer of Care Note  Patient: Peggy Pacheco  Procedure(s) Performed: CATARACT EXTRACTION PHACO AND INTRAOCULAR LENS PLACEMENT (IOC) RIGHT HEALON 5 VISION BLUE  39.56  03:11.1 (Right: Eye)  Patient Location: PACU  Anesthesia Type: MAC  Level of Consciousness: awake, alert  and patient cooperative  Airway and Oxygen Therapy: Patient Spontanous Breathing and Patient connected to supplemental oxygen  Post-op Assessment: Post-op Vital signs reviewed, Patient's Cardiovascular Status Stable, Respiratory Function Stable, Patent Airway and No signs of Nausea or vomiting  Post-op Vital Signs: Reviewed and stable  Complications: No notable events documented.

## 2022-05-25 NOTE — Op Note (Signed)
OPERATIVE NOTE  Peggy Pacheco [id number] 05/25/2022   PREOPERATIVE DIAGNOSIS: Nuclear sclerotic cataract right eye. H25.11   POSTOPERATIVE DIAGNOSIS: Nuclear sclerotic cataract right eye. H25.11   PROCEDURE:  Phacoemusification with posterior chamber intraocular lens placement of the right eye  Ultrasound time: Procedure(s): CATARACT EXTRACTION PHACO AND INTRAOCULAR LENS PLACEMENT (IOC) RIGHT HEALON 5 VISION BLUE  39.56  03:11.1 (Right)  LENS:   Implant Name Type Inv. Item Serial No. Manufacturer Lot No. LRB No. Used Action  LENS IOL TECNIS EYHANCE 19.5 - [id number] Intraocular Lens LENS IOL TECNIS EYHANCE 19.5 [id number] [employer]  Right 1 Implanted      SURGEON:  Julious Payer. Rolley Sims, MD   ANESTHESIA:  Topical with tetracaine drops, augmented with 1% preservative-free intracameral lidocaine.   COMPLICATIONS:  None.   DESCRIPTION OF PROCEDURE:  The patient was identified in the holding room and transported to the operating room and placed in the supine position under the operating microscope.  The right eye was identified as the operative eye. Due to the dense nature of the cataract and the possibility of needing to convert to manual small incision cataract surgery (MSICS), a retrobulbar block was performed using a mix of 3:3:1 of bupivicaine, xylocaine and hyluronic acid. Prior to injection, the patient was sedated and the right lower lid was cleaned with multiple alcohol swabs. A total of 3 Ccs was administered. The patient was then prepped and draped in the usual sterile ophthalmic fashion.   A 1 millimeter clear-corneal paracentesis was made superotemporally. Preservative-free 1% lidocaine mixed with 1:1,000 bisulfite-free aqueous solution of epinephrine was injected into the anterior chamber. There was no red reflex so Trypan blue was used to stain the anterior capsule to facilitate safe creation of the capsulorrhexis. The anterior chamber was then filled with Healon V viscoelastic. A  2.4 millimeter keratome was used to make a clear-corneal incision inferotemporally. A curvilinear capsulorrhexis was made with a cystotome and capsulorrhexis forceps. Balanced salt solution was used to hydrodissect the nucleus. Phacoemulsification was then used to remove the lens nucleus and epinucleus. The remaining cortex was then removed using the irrigation and aspiration handpiece. Provisc was then placed into the capsular bag to distend it for lens placement. A +19.50 D DIB00 intraocular lens was then injected into the capsular bag. The remaining viscoelastic was aspirated.   Wounds were hydrated with balanced salt solution.  The anterior chamber was inflated to a physiologic pressure with balanced salt solution.  No wound leaks were noted. Vigamox was injected intracamerally.  Timolol and Brimonidine drops were applied to the eye.  The patient was taken to the recovery room in stable condition without complications of anesthesia or surgery.  [name] [place] [employer] 05/25/2022, 2:23 PM

## 2022-05-25 NOTE — Anesthesia Postprocedure Evaluation (Signed)
Anesthesia Post Note  Patient: Peggy Pacheco  Procedure(s) Performed: CATARACT EXTRACTION PHACO AND INTRAOCULAR LENS PLACEMENT (IOC) RIGHT HEALON 5 VISION BLUE  39.56  03:11.1 (Right: Eye)  Patient location during evaluation: PACU Anesthesia Type: MAC Level of consciousness: awake and alert Pain management: pain level controlled Vital Signs Assessment: post-procedure vital signs reviewed and stable Respiratory status: spontaneous breathing, nonlabored ventilation, respiratory function stable and patient connected to nasal cannula oxygen Cardiovascular status: stable and blood pressure returned to baseline Postop Assessment: no apparent nausea or vomiting Anesthetic complications: no   No notable events documented.   Last Vitals:  Vitals:   05/25/22 1425 05/25/22 1430  BP: (!) 144/90 (!) 165/83  Pulse: 80 83  Resp: 15 18  Temp: 36.7 C 36.7 C  SpO2: 99% 99%    Last Pain:  Vitals:   05/25/22 1430  TempSrc:   PainSc: 0-No pain                 Geraldo Haris C Jaida Basurto

## 2022-05-26 ENCOUNTER — Encounter: Payer: Self-pay | Admitting: Ophthalmology

## 2022-05-26 DIAGNOSIS — H2512 Age-related nuclear cataract, left eye: Secondary | ICD-10-CM | POA: Diagnosis not present

## 2022-06-20 NOTE — Discharge Instructions (Signed)

## 2022-06-22 ENCOUNTER — Encounter
Admission: RE | Disposition: A | Discharge status: Home / Self Care | Payer: Self-pay | Source: Home / Self Care | Attending: Ophthalmology

## 2022-06-22 ENCOUNTER — Ambulatory Visit: Payer: 59 | Admitting: Anesthesiology

## 2022-06-22 ENCOUNTER — Encounter: Payer: Self-pay | Admitting: Ophthalmology

## 2022-06-22 ENCOUNTER — Other Ambulatory Visit: Payer: Self-pay

## 2022-06-22 ENCOUNTER — Ambulatory Visit
Admission: RE | Admit: 2022-06-22 | Discharge: 2022-06-22 | Disposition: A | Discharge status: Home / Self Care | Payer: 59 | Attending: Ophthalmology | Admitting: Ophthalmology

## 2022-06-22 DIAGNOSIS — J449 Chronic obstructive pulmonary disease, unspecified: Secondary | ICD-10-CM | POA: Diagnosis not present

## 2022-06-22 DIAGNOSIS — D472 Monoclonal gammopathy: Secondary | ICD-10-CM | POA: Insufficient documentation

## 2022-06-22 DIAGNOSIS — H2512 Age-related nuclear cataract, left eye: Secondary | ICD-10-CM | POA: Insufficient documentation

## 2022-06-22 DIAGNOSIS — F17210 Nicotine dependence, cigarettes, uncomplicated: Secondary | ICD-10-CM | POA: Insufficient documentation

## 2022-06-22 HISTORY — PX: CATARACT EXTRACTION W/PHACO: SHX586

## 2022-06-22 SURGERY — PHACOEMULSIFICATION, CATARACT, WITH IOL INSERTION
Anesthesia: Monitor Anesthesia Care | Site: Eye | Laterality: Left

## 2022-06-22 MED ORDER — TETRACAINE HCL 0.5 % OP SOLN
1.0000 [drp] | OPHTHALMIC | Status: DC | PRN
  Administered 2022-06-22 (×3): 1 [drp] via OPHTHALMIC

## 2022-06-22 MED ORDER — LIDOCAINE HCL (PF) 2 % IJ SOLN
INTRAOCULAR | Status: DC | PRN
  Administered 2022-06-22: 1 mL via INTRAOCULAR

## 2022-06-22 MED ORDER — BRIMONIDINE TARTRATE-TIMOLOL 0.2-0.5 % OP SOLN
OPHTHALMIC | Status: DC | PRN
  Administered 2022-06-22: 1 [drp] via OPHTHALMIC

## 2022-06-22 MED ORDER — MIDAZOLAM HCL 2 MG/2ML IJ SOLN
INTRAMUSCULAR | Status: DC | PRN
  Administered 2022-06-22: 2 mg via INTRAVENOUS

## 2022-06-22 MED ORDER — SIGHTPATH DOSE#1 BSS IO SOLN
INTRAOCULAR | Status: DC | PRN
  Administered 2022-06-22: 79 mL via OPHTHALMIC

## 2022-06-22 MED ORDER — FENTANYL CITRATE (PF) 100 MCG/2ML IJ SOLN
INTRAMUSCULAR | Status: DC | PRN
  Administered 2022-06-22 (×2): 50 ug via INTRAVENOUS

## 2022-06-22 MED ORDER — SIGHTPATH DOSE#1 NA HYALUR & NA CHOND-NA HYALUR IO KIT
PACK | INTRAOCULAR | Status: DC | PRN
  Administered 2022-06-22: 1 via OPHTHALMIC

## 2022-06-22 MED ORDER — LACTATED RINGERS IV SOLN: INTRAVENOUS | Status: DC

## 2022-06-22 MED ORDER — MOXIFLOXACIN HCL 0.5 % OP SOLN
OPHTHALMIC | Status: DC | PRN
  Administered 2022-06-22: .2 mL via OPHTHALMIC

## 2022-06-22 MED ORDER — TRYPAN BLUE 0.06 % IO SOSY
PREFILLED_SYRINGE | INTRAOCULAR | Status: DC | PRN
  Administered 2022-06-22: .5 mL via INTRAOCULAR

## 2022-06-22 MED ORDER — SIGHTPATH DOSE#1 BSS IO SOLN
INTRAOCULAR | Status: DC | PRN
  Administered 2022-06-22: 15 mL

## 2022-06-22 MED ORDER — ARMC OPHTHALMIC DILATING DROPS
1.0000 | OPHTHALMIC | Status: DC | PRN
  Administered 2022-06-22 (×3): 1 via OPHTHALMIC

## 2022-06-22 SURGICAL SUPPLY — 20 items
BNDG EYE OVAL 2 1/8 X 2 5/8 (GAUZE/BANDAGES/DRESSINGS) IMPLANT
CANNULA ANT/CHMB 27G (MISCELLANEOUS) IMPLANT
CANNULA ANT/CHMB 27GA (MISCELLANEOUS) ×1 IMPLANT
CATARACT SUITE SIGHTPATH (MISCELLANEOUS) ×1 IMPLANT
DISSECTOR HYDRO NUCLEUS 50X22 (MISCELLANEOUS) ×1 IMPLANT
DRSG TEGADERM 2-3/8X2-3/4 SM (GAUZE/BANDAGES/DRESSINGS) ×1 IMPLANT
FEE CATARACT SUITE SIGHTPATH (MISCELLANEOUS) ×1 IMPLANT
GLOVE SURG SYN 7.5 E (GLOVE) ×1 IMPLANT
GLOVE SURG SYN 7.5 PF PI (GLOVE) ×1 IMPLANT
GLOVE SURG SYN 8.5 E (GLOVE) ×1 IMPLANT
GLOVE SURG SYN 8.5 PF PI (GLOVE) ×1 IMPLANT
LENS IOL TECNIS EYHANCE 19.5 (Intraocular Lens) IMPLANT
NDL FILTER BLUNT 18X1 1/2 (NEEDLE) IMPLANT
NEEDLE FILTER BLUNT 18X1 1/2 (NEEDLE) IMPLANT
PACK VIT ANT 23G (MISCELLANEOUS) IMPLANT
RING MALYGIN 7.0 (MISCELLANEOUS) IMPLANT
SUT ETHILON 10-0 CS-B-6CS-B-6 (SUTURE)
SUTURE EHLN 10-0 CS-B-6CS-B-6 (SUTURE) IMPLANT
SYR 3ML LL SCALE MARK (SYRINGE) IMPLANT
SYR 5ML LL (SYRINGE) IMPLANT

## 2022-06-22 NOTE — Anesthesia Postprocedure Evaluation (Signed)
Anesthesia Post Note  Patient: Peggy Pacheco  Procedure(s) Performed: CATARACT EXTRACTION PHACO AND INTRAOCULAR LENS PLACEMENT (IOC) LEFT MALYUGIN (Left: Eye)  Patient location during evaluation: PACU Anesthesia Type: MAC Level of consciousness: awake and alert Pain management: pain level controlled Vital Signs Assessment: post-procedure vital signs reviewed and stable Respiratory status: spontaneous breathing, nonlabored ventilation, respiratory function stable and patient connected to nasal cannula oxygen Cardiovascular status: stable and blood pressure returned to baseline Postop Assessment: no apparent nausea or vomiting Anesthetic complications: no   No notable events documented.   Last Vitals:  Vitals:   06/22/22 1325 06/22/22 1329  BP: 133/87 (!) 148/92  Pulse: 89 93  Resp: 12 12  Temp:  (!) 36.3 C  SpO2: 97% 96%    Last Pain:  Vitals:   06/22/22 1329  TempSrc:   PainSc: 0-No pain                 Kasper Mudrick C Aseel Truxillo

## 2022-06-22 NOTE — Anesthesia Preprocedure Evaluation (Addendum)
Anesthesia Evaluation  Patient identified by MRN, date of birth, ID band Patient awake    Reviewed: Allergy & Precautions, H&P , NPO status , Patient's Chart, lab work & pertinent test results  Airway Mallampati: III  TM Distance: >3 FB Neck ROM: Full    Dental no notable dental hx.  Missing right upper central incisor, several capped or veneer, none loose, other missing:   Pulmonary asthma , COPD, Current Smoker   Pulmonary exam normal breath sounds clear to auscultation       Cardiovascular hypertension, Normal cardiovascular exam Rhythm:Regular Rate:Normal   11-29-20 EF 55% with grade I diastolic dysfunction But December 22, no mention of grade I diastolic dysfunction    Neuro/Psych  Neuromuscular disease negative neurological ROS  negative psych ROS   GI/Hepatic Neg liver ROS,GERD  ,,  Endo/Other  negative endocrine ROS    Renal/GU negative Renal ROS  negative genitourinary   Musculoskeletal negative musculoskeletal ROS (+)    Abdominal   Peds negative pediatric ROS (+)  Hematology negative hematology ROS (+)   Anesthesia Other Findings Recent cataract surgery 05-25-22  GERD (gastroesophageal reflux disease) Alcohol use Tobacco abuse Pericardial effusion Diastolic dysfunction Asthma 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   Reproductive/Obstetrics negative OB ROS                              Anesthesia Physical Anesthesia Plan  ASA: 3  Anesthesia Plan: MAC   Post-op Pain Management:    Induction: Intravenous  PONV Risk Score and Plan:   Airway Management Planned: Natural Airway and Nasal Cannula  Additional Equipment:   Intra-op Plan:   Post-operative Plan:   Informed Consent: I have reviewed the patients History and Physical, chart, labs and discussed the procedure including the risks, benefits and alternatives for the  proposed anesthesia with the patient or authorized representative who has indicated his/her understanding and acceptance.     Dental Advisory Given  Plan Discussed with: Anesthesiologist, CRNA and Surgeon  Anesthesia Plan Comments: (Patient consented for risks of anesthesia including but not limited to:  - adverse reactions to medications - damage to eyes, teeth, lips or other oral mucosa - nerve damage due to positioning  - sore throat or hoarseness - Damage to heart, brain, nerves, lungs, other parts of body or loss of life  Patient voiced understanding.)         Anesthesia Quick Evaluation

## 2022-06-22 NOTE — Op Note (Signed)
OPERATIVE NOTE  Peggy Pacheco [id number] 06/22/2022   PREOPERATIVE DIAGNOSIS: Nuclear sclerotic cataract left eye. H25.12   POSTOPERATIVE DIAGNOSIS: Nuclear sclerotic cataract left eye. H25.12   PROCEDURE:  Phacoemusification with posterior chamber intraocular lens placement of the left eye  Ultrasound time: Procedure(s) with comments: CATARACT EXTRACTION PHACO AND INTRAOCULAR LENS PLACEMENT (IOC) LEFT MALYUGIN (Left) - 15.75 1:28.3  LENS:   Implant Name Type Inv. Item Serial No. Manufacturer Lot No. LRB No. Used Action  LENS IOL TECNIS EYHANCE 19.5 - [id number] Intraocular Lens LENS IOL TECNIS EYHANCE 19.5 [id number] [employer]  Left 1 Implanted      SURGEON:  Julious Payer. Rolley Sims, MD   ANESTHESIA:  Topical with tetracaine drops, augmented with 1% preservative-free intracameral lidocaine.   COMPLICATIONS:  None.   DESCRIPTION OF PROCEDURE:  The patient was identified in the holding room and transported to the operating room and placed in the supine position under the operating microscope.  The left eye was identified as the operative eye, which was prepped and draped in the usual sterile ophthalmic fashion.   A 1 millimeter clear-corneal paracentesis was made inferotemporally. Preservative-free 1% lidocaine mixed with 1:1,000 bisulfite-free aqueous solution of epinephrine was injected into the anterior chamber. There was no red reflex so Trypan blue was injected intracamerally to stain the anterior capsule to facilitate the safe creation of the capsulorrhexis. The anterior chamber was then filled with Viscoat viscoelastic. A 2.4 millimeter keratome was used to make a clear-corneal incision superotemporally. A curvilinear capsulorrhexis was made with a cystotome and capsulorrhexis forceps. Balanced salt solution was used to hydrodissect and hydrodelineate the nucleus. Phacoemulsification was then used to remove the lens nucleus and epinucleus. The remaining cortex was then removed using the  irrigation and aspiration handpiece. Provisc was then placed into the capsular bag to distend it for lens placement. A +19.50 D DIB00 intraocular lens was then injected into the capsular bag. The remaining viscoelastic was aspirated.   Wounds were hydrated with balanced salt solution.  The anterior chamber was inflated to a physiologic pressure with balanced salt solution.  No wound leaks were noted. Vigamox was injected intracamerally.  Timolol and Brimonidine drops were applied to the eye.  The patient was taken to the recovery room in stable condition without complications of anesthesia or surgery.  [employer] 06/22/2022, 1:21 PM

## 2022-06-22 NOTE — H&P (Signed)
[facility]   Primary Care Physician:  Carlena Sax, MD Ophthalmologist: Dr. Deberah Pelton  Pre-Procedure History & Physical: HPI:  Peggy Pacheco is a 62 y.o. female here for cataract surgery.   Past Medical History:  Diagnosis Date   Alcohol use    a. Quit 09/2020.   Alcoholic polyneuropathy (HCC)    Asthma    Diastolic dysfunction    GERD (gastroesophageal reflux disease)    Monoclonal gammopathy of unknown significance    Pericardial effusion    a.09/2020 Echo: EF 60-65%, no rwma, GrI DD, Nl RV fxn. Small-mod circumferential pericardial effusion. Triv TR. Mild-mod AoV sclerosis; b. 10/2020 Echo: EF 60-65%, no rwma. Mod circumferential pericardial effusion.   Tobacco abuse    a. Cutting back - 1/3-1/2 ppd.    Past Surgical History:  Procedure Laterality Date   CATARACT EXTRACTION W/PHACO Right 05/25/2022   Procedure: CATARACT EXTRACTION PHACO AND INTRAOCULAR LENS PLACEMENT (IOC) RIGHT HEALON 5 VISION BLUE  39.56  03:11.1;  Surgeon: Estanislado Pandy, MD;  Location: [facility];  Service: Ophthalmology;  Laterality: Right;   NO PAST SURGERIES      Prior to Admission medications   Medication Sig Start Date End Date Taking? Authorizing Provider  albuterol (VENTOLIN HFA) 108 (90 Base) MCG/ACT inhaler Inhale 2 puffs into the lungs every 4 (four) hours as needed. 07/31/21   Becky Augusta, NP  celecoxib (CELEBREX) 100 MG capsule Take 200 mg by mouth 2 (two) times daily. 01/26/22   [provider]  gabapentin (NEURONTIN) 300 MG capsule Take 600 mg by mouth 5 (five) times daily. 03/15/21   [provider]  nortriptyline (PAMELOR) 10 MG capsule Take 20 mg by mouth 2 (two) times daily.    [provider]  Spacer/Aero-Holding Chambers (AEROCHAMBER MV) inhaler Use as instructed Patient not taking: Reported on 05/18/2022 07/31/21   Becky Augusta, NP  torsemide 40 MG TABS Take 40 mg by mouth daily. Patient not taking: Reported on 02/22/2022 12/02/20    Delfino Lovett, MD  traZODone (DESYREL) 100 MG tablet Take by mouth. 05/23/21   [provider]  fluticasone (FLONASE) 50 MCG/ACT nasal spray Place 2 sprays into both nostrils daily. 09/16/19 05/03/20  Domenick Gong, MD    Allergies as of 04/13/2022   (No Known Allergies)    Family History  Adopted: Yes  Problem Relation Age of Onset   Congestive Heart Failure Mother    Kidney failure Mother    Asthma Mother    Colon cancer Father    Diabetes Father    Multiple myeloma Sister    Melanoma Sister    Congestive Heart Failure Sister    Lupus Sister    Sarcoidosis Sister    Rheum arthritis Sister    Heart attack Brother    Cancer Brother        unknown-finger   Diabetes Brother    Cirrhosis Brother     Social History   Socioeconomic History   Marital status: Widowed    Spouse name: Not on file   Number of children: Not on file   Years of education: Not on file   Highest education level: Not on file  Occupational History   Not on file  Tobacco Use   Smoking status: Every Day    Packs/day: 0.50    Years: 45.00    Additional pack years: 0.00    Total pack years: 22.50    Types: Cigarettes   Smokeless tobacco: Never  Vaping  Use   Vaping Use: Never used  Substance and Sexual Activity   Alcohol use: Yes    Comment: occassionally   Drug use: Yes    Types: Marijuana   Sexual activity: Not Currently  Other Topics Concern   Not on file  Social History Narrative   Patient lives in [place].  Active smoker.  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    Social Determinants of Health   Financial Resource Strain: Not on file  Food Insecurity: Not on file  Transportation Needs: Not on file  Physical Activity: Not on file  Stress: Not on file  Social Connections: Not on file  Intimate Partner Violence: Not on file    Review of Systems: See HPI, otherwise negative ROS  Physical Exam: There were no vitals taken for this  visit. General:   Alert, cooperative in NAD Head:  Normocephalic and atraumatic. Respiratory:  Normal work of breathing. Cardiovascular:  RRR  Impression/Plan: Peggy Pacheco is here for cataract surgery.  Risks, benefits, limitations, and alternatives regarding cataract surgery have been reviewed with the patient.  Questions have been answered.  All parties agreeable.   Estanislado Pandy, MD  06/22/2022, 11:05 AM

## 2022-06-22 NOTE — Transfer of Care (Signed)
Immediate Anesthesia Transfer of Care Note  Patient: Peggy Pacheco  Procedure(s) Performed: CATARACT EXTRACTION PHACO AND INTRAOCULAR LENS PLACEMENT (IOC) LEFT MALYUGIN (Left: Eye)  Patient Location: PACU  Anesthesia Type: MAC  Level of Consciousness: awake, alert  and patient cooperative  Airway and Oxygen Therapy: Patient Spontanous Breathing and Patient connected to supplemental oxygen  Post-op Assessment: Post-op Vital signs reviewed, Patient's Cardiovascular Status Stable, Respiratory Function Stable, Patent Airway and No signs of Nausea or vomiting  Post-op Vital Signs: Reviewed and stable  Complications: No notable events documented.

## 2022-06-23 ENCOUNTER — Encounter: Payer: Self-pay | Admitting: Ophthalmology

## 2022-11-07 ENCOUNTER — Ambulatory Visit: Payer: Self-pay

## 2022-11-07 ENCOUNTER — Ambulatory Visit
Admission: EM | Admit: 2022-11-07 | Discharge: 2022-11-07 | Disposition: A | Discharge status: Home / Self Care | Payer: Self-pay | Attending: Physician Assistant | Admitting: Physician Assistant

## 2022-11-07 DIAGNOSIS — K047 Periapical abscess without sinus: Secondary | ICD-10-CM

## 2022-11-07 DIAGNOSIS — J441 Chronic obstructive pulmonary disease with (acute) exacerbation: Secondary | ICD-10-CM

## 2022-11-07 DIAGNOSIS — R052 Subacute cough: Secondary | ICD-10-CM

## 2022-11-07 MED ORDER — CEFTRIAXONE SODIUM 1 G IJ SOLR
1.0000 g | Freq: Once | INTRAMUSCULAR | Status: AC
  Administered 2022-11-07: 1 g via INTRAMUSCULAR

## 2022-11-07 MED ORDER — AMOXICILLIN-POT CLAVULANATE 875-125 MG PO TABS: 1.0000 | ORAL_TABLET | Freq: Two times a day (BID) | ORAL | 0 refills | Status: AC

## 2022-11-07 MED ORDER — ALBUTEROL SULFATE HFA 108 (90 BASE) MCG/ACT IN AERS: 1.0000 | INHALATION_SPRAY | Freq: Four times a day (QID) | RESPIRATORY_TRACT | 0 refills | Status: AC | PRN

## 2022-11-07 MED ORDER — HYDROCODONE-ACETAMINOPHEN 5-325 MG PO TABS: 1.0000 | ORAL_TABLET | Freq: Four times a day (QID) | ORAL | 0 refills | Status: AC | PRN

## 2022-11-07 NOTE — ED Triage Notes (Signed)
Tooth pain x 3 days and jaw swelling that happened over night.   Cough x 6-10 weeks. Wheezing and congestion.

## 2022-11-07 NOTE — ED Provider Notes (Signed)
[facility]    CSN: [id number] Arrival date & time: 11/07/22  0915      History   Chief Complaint Chief Complaint  Patient presents with   Cough   Dental Pain    HPI Peggy Pacheco is a 62 y.o. female with history of asthma and COPD.  She presents today for 2 separate complaints.  Reports pain of a tooth of the left lower side for the past couple of days.  Overnight she developed swelling and intense worsening pain of the left jaw.  Unsure if she has had a fever.  Has been taken NSAIDs with the last dose earlier this morning.  Temp currently 98.6 degrees.  Patient does not have a dentist.  Patient also presenting for approximately 64-month history of cough, congestion and wheezing.  Reports some shortness of breath.  Out of albuterol inhaler and requests a refill.  No other complaints.  HPI  Past Medical History:  Diagnosis Date   Alcohol use    a. Quit 09/2020.   Alcoholic polyneuropathy (HCC)    Asthma    Diastolic dysfunction    GERD (gastroesophageal reflux disease)    Monoclonal gammopathy of unknown significance    Pericardial effusion    a.09/2020 Echo: EF 60-65%, no rwma, GrI DD, Nl RV fxn. Small-mod circumferential pericardial effusion. Triv TR. Mild-mod AoV sclerosis; b. 10/2020 Echo: EF 60-65%, no rwma. Mod circumferential pericardial effusion.   Tobacco abuse    a. Cutting back - 1/3-1/2 ppd.    Patient Active Problem List   Diagnosis Date Noted   Monoclonal gammopathy of unknown significance (MGUS) 06/28/2021   S/P thoracentesis    Anasarca 11/29/2020   Influenza A 11/29/2020   COPD with acute bronchitis (HCC) 11/29/2020   Peripheral edema    Pericardial effusion 09/13/2020   Pleural effusion 09/13/2020   Bilateral leg edema 09/13/2020   Numbness in both arms and submandibular area 09/13/2020   Tobacco abuse 09/13/2020   Alcohol use 09/13/2020   SOB (shortness of breath) 09/13/2020   Hypertension 09/13/2020   GERD (gastroesophageal reflux  disease)     Past Surgical History:  Procedure Laterality Date   CATARACT EXTRACTION W/PHACO Right 05/25/2022   Procedure: CATARACT EXTRACTION PHACO AND INTRAOCULAR LENS PLACEMENT (IOC) RIGHT HEALON 5 VISION BLUE  39.56  03:11.1;  Surgeon: Estanislado Pandy, MD;  Location: [facility];  Service: Ophthalmology;  Laterality: Right;   CATARACT EXTRACTION W/PHACO Left 06/22/2022   Procedure: CATARACT EXTRACTION PHACO AND INTRAOCULAR LENS PLACEMENT (IOC) LEFT MALYUGIN;  Surgeon: Estanislado Pandy, MD;  Location: [facility];  Service: Ophthalmology;  Laterality: Left;  15.75 1:28.3   NO PAST SURGERIES      OB History   No obstetric history on file.      Home Medications    Prior to Admission medications   Medication Sig Start Date End Date Taking? Authorizing Provider  albuterol (VENTOLIN HFA) 108 (90 Base) MCG/ACT inhaler Inhale 1-2 puffs into the lungs every 6 (six) hours as needed for wheezing or shortness of breath. 11/07/22  Yes Eusebio Friendly B, PA-C  amoxicillin-clavulanate (AUGMENTIN) 875-125 MG tablet Take 1 tablet by mouth every 12 (twelve) hours for 10 days. 11/07/22 11/17/22 Yes Shirlee Latch, PA-C  HYDROcodone-acetaminophen (NORCO/VICODIN) 5-325 MG tablet Take 1 tablet by mouth every 6 (six) hours as needed for up to 3 days for severe pain (pain score 7-10). 11/07/22 11/10/22 Yes Eusebio Friendly B, PA-C  celecoxib (CELEBREX) 100 MG capsule Take  200 mg by mouth 2 (two) times daily. 01/26/22   [provider]  gabapentin (NEURONTIN) 300 MG capsule Take 600 mg by mouth 5 (five) times daily. 03/15/21   [provider]  nortriptyline (PAMELOR) 10 MG capsule Take 20 mg by mouth 2 (two) times daily.    [provider]  Spacer/Aero-Holding Chambers (AEROCHAMBER MV) inhaler Use as instructed Patient not taking: Reported on 05/18/2022 07/31/21   Becky Augusta, NP  torsemide 40 MG TABS Take 40 mg by mouth daily. Patient not taking: Reported on  02/22/2022 12/02/20   Delfino Lovett, MD  traZODone (DESYREL) 100 MG tablet Take by mouth. 05/23/21   [provider]  fluticasone (FLONASE) 50 MCG/ACT nasal spray Place 2 sprays into both nostrils daily. 09/16/19 05/03/20  Domenick Gong, MD    Family History Family History  Adopted: Yes  Problem Relation Age of Onset   Congestive Heart Failure Mother    Kidney failure Mother    Asthma Mother    Colon cancer Father    Diabetes Father    Multiple myeloma Sister    Melanoma Sister    Congestive Heart Failure Sister    Lupus Sister    Sarcoidosis Sister    Rheum arthritis Sister    Heart attack Brother    Cancer Brother        unknown-finger   Diabetes Brother    Cirrhosis Brother     Social History Social History   Tobacco Use   Smoking status: Every Day    Current packs/day: 0.50    Average packs/day: 0.5 packs/day for 45.0 years (22.5 ttl pk-yrs)    Types: Cigarettes   Smokeless tobacco: Never  Vaping Use   Vaping status: Never Used  Substance Use Topics   Alcohol use: Yes    Comment: occassionally   Drug use: Yes    Types: Marijuana     Allergies   Patient has no known allergies.   Review of Systems Review of Systems  Constitutional:  Negative for chills, diaphoresis, fatigue and fever.  HENT:  Positive for congestion, dental problem and facial swelling. Negative for ear pain, rhinorrhea, sinus pressure, sinus pain and sore throat.   Respiratory:  Positive for cough, shortness of breath and wheezing.   Gastrointestinal:  Negative for abdominal pain, nausea and vomiting.  Musculoskeletal:  Negative for arthralgias and myalgias.  Skin:  Negative for rash.  Neurological:  Negative for weakness and headaches.  Hematological:  Negative for adenopathy.     Physical Exam Triage Vital Signs ED Triage Vitals  Encounter Vitals Group     BP 11/07/22 0928 (!) 162/94     Systolic BP Percentile --      Diastolic BP Percentile --      Pulse Rate 11/07/22  0928 91     Resp 11/07/22 0928 18     Temp 11/07/22 0928 98.6 F (37 C)     Temp Source 11/07/22 0928 Oral     SpO2 11/07/22 0928 99 %     Weight --      Height --      Head Circumference --      Peak Flow --      Pain Score 11/07/22 0926 10     Pain Loc --      Pain Education --      Exclude from Growth Chart --    No data found.  Updated Vital Signs BP (!) 162/94 (BP Location: Right Arm)   Pulse  91   Temp 98.6 F (37 C) (Oral)   Resp 18   SpO2 99%    Physical Exam Vitals and nursing note reviewed.  Constitutional:      General: She is not in acute distress.    Appearance: Normal appearance. She is not ill-appearing or toxic-appearing.  HENT:     Head: Normocephalic and atraumatic.     Nose: Nose normal.     Mouth/Throat:     Mouth: Mucous membranes are moist.     Dentition: Abnormal dentition. Dental tenderness, dental caries and dental abscesses (left lower side) present.     Pharynx: Oropharynx is clear.      Comments: Dental decay/caries of affected teeth of the left lower side.  Teeth are tender.  Moderate swelling of the left lower jaw with firmness.  Suspect possible developing abscess. Eyes:     General: No scleral icterus.       Right eye: No discharge.        Left eye: No discharge.     Conjunctiva/sclera: Conjunctivae normal.  Cardiovascular:     Rate and Rhythm: Normal rate and regular rhythm.     Heart sounds: Normal heart sounds.  Pulmonary:     Effort: Pulmonary effort is normal. No respiratory distress.     Breath sounds: Wheezing present.  Musculoskeletal:     Cervical back: Neck supple.  Skin:    General: Skin is dry.  Neurological:     General: No focal deficit present.     Mental Status: She is alert. Mental status is at baseline.     Motor: No weakness.     Gait: Gait normal.  Psychiatric:        Mood and Affect: Mood normal.        Behavior: Behavior normal.        Thought Content: Thought content normal.      UC Treatments /  Results  Labs (all labs ordered are listed, but only abnormal results are displayed) Labs Reviewed - No data to display  EKG   Radiology DG Chest 2 View  Result Date: 11/07/2022 CLINICAL DATA:  62 year old female with cough for 2 months. Wheezing and congestion. Tooth pain. EXAM: CHEST - 2 VIEW COMPARISON:  Chest radiographs 07/31/2021 and earlier. FINDINGS: Lung volumes are at the upper limits of normal. There is a small chronic right pleural effusion versus pleural thickening, unchanged from last year. Left lung base opacity at that time has resolved. No pneumothorax, pulmonary edema, acute pulmonary opacity. Normal cardiac size and mediastinal contours. Visualized tracheal air column is within normal limits. Osteopenia. No acute osseous abnormality identified. Abdominal Calcified aortic atherosclerosis. Negative visible bowel gas. IMPRESSION: 1. Resolved left lower lobe pneumonia seen last year. Small chronic right pleural effusion or pleural thickening not significantly changed. No acute cardiopulmonary abnormality. 2. Aortic Atherosclerosis (ICD10-I70.0). Electronically Signed   By: Odessa Fleming M.D.   On: 11/07/2022 12:13    Procedures Procedures (including critical care time)  Medications Ordered in UC Medications  cefTRIAXone (ROCEPHIN) injection 1 g (1 g Intramuscular Given 11/07/22 1022)    Initial Impression / Assessment and Plan / UC Course  I have reviewed the triage vital signs and the nursing notes.  Pertinent labs & imaging results that were available during my care of the patient were reviewed by me and considered in my medical decision making (see chart for details).   62 year old female with history of asthma and COPD, tobacco abuse presents for 2 separate  complaints.  Reports cough and congestion with wheezing and shortness of breath for the past couple of months.  Needs refill of albuterol inhaler.  Also reports dental pain and swelling of left lower jaw.  Oxygen 99%.  She  is afebrile.  Patient is nontoxic.  On exam has swelling, induration and firmness of the left lower jaw, dental decay and caries of multiple teeth of the left lower side and tenderness.  Few scattered wheezes throughout chest.  Chest x-ray obtained.  Patient given 1 g IM Rocephin for dental abscess.  Sent Augmentin to pharmacy and also hydrocodone for pain relief.  Reviewed supportive care and close monitoring.  Reviewed if no improvement in 24 to 48 hours or worsening symptoms she may need to go to the emergency department.  Information given for dentist to follow-up with.  If chest x-ray shows pneumonia we will add additional antibiotics.  Holding off on prednisone at this time for COPD exacerbation as I do not want to suppress her immune system and potentially worsen the abscess.  Patient is agreeable understanding.  Refilled albuterol.  No acute abnormality seen on patient's chest x-ray.  Send results to her in MyChart.  Will have her follow-up with primary care provider for ongoing pulmonary symptoms.  Final Clinical Impressions(s) / UC Diagnoses   Final diagnoses:  Dental abscess  COPD exacerbation (HCC)  Subacute cough     Discharge Instructions      -You likely have a dental abscess.  We gave you a injection of an antibiotic.  In about 12 hours take the first dose of the Augmentin I sent to the pharmacy.  I also sent pain medicine. - I will call if your x-ray shows any evidence of pneumonia and send additional antibiotics.  I refilled albuterol inhaler.  Increase rest and fluids.  Will hold off on prednisone at this time as I do not want to make the abscess worse. - Follow-up with dentistry as soon as possible. - If no improvement in the next 24 hours, development of fever, increased swelling, worsening pain you may need to go to the emergency department.  [facility] - [place], [place] [address]  [phone]     ED Prescriptions     Medication Sig Dispense Auth.  Provider   amoxicillin-clavulanate (AUGMENTIN) 875-125 MG tablet Take 1 tablet by mouth every 12 (twelve) hours for 10 days. 20 tablet Shirlee Latch, PA-C   HYDROcodone-acetaminophen (NORCO/VICODIN) 5-325 MG tablet Take 1 tablet by mouth every 6 (six) hours as needed for up to 3 days for severe pain (pain score 7-10). 6 tablet Eusebio Friendly B, PA-C   albuterol (VENTOLIN HFA) 108 (90 Base) MCG/ACT inhaler Inhale 1-2 puffs into the lungs every 6 (six) hours as needed for wheezing or shortness of breath. 1 g Shirlee Latch, PA-C      I have reviewed the PDMP during this encounter.   Shirlee Latch, PA-C 11/07/22 1221

## 2022-11-07 NOTE — Discharge Instructions (Addendum)
-  You likely have a dental abscess.  We gave you a injection of an antibiotic.  In about 12 hours take the first dose of the Augmentin I sent to the pharmacy.  I also sent pain medicine. - I will call if your x-ray shows any evidence of pneumonia and send additional antibiotics.  I refilled albuterol inhaler.  Increase rest and fluids.  Will hold off on prednisone at this time as I do not want to make the abscess worse. - Follow-up with dentistry as soon as possible. - If no improvement in the next 24 hours, development of fever, increased swelling, worsening pain you may need to go to the emergency department.  [facility] - [place], [place] [address]  [phone]

## 2023-06-29 ENCOUNTER — Other Ambulatory Visit: Payer: Self-pay | Admitting: *Deleted

## 2023-06-29 DIAGNOSIS — D472 Monoclonal gammopathy: Secondary | ICD-10-CM

## 2023-07-12 ENCOUNTER — Other Ambulatory Visit: Payer: Self-pay

## 2023-07-13 ENCOUNTER — Inpatient Hospital Stay: Attending: Internal Medicine

## 2023-07-13 ENCOUNTER — Encounter: Payer: Self-pay | Admitting: Nurse Practitioner

## 2023-07-13 ENCOUNTER — Inpatient Hospital Stay: Admitting: Nurse Practitioner

## 2023-07-13 VITALS — BP 158/89 | HR 80 | Temp 97.8°F | Resp 19 | Wt 156.5 lb

## 2023-07-13 DIAGNOSIS — F17210 Nicotine dependence, cigarettes, uncomplicated: Secondary | ICD-10-CM | POA: Diagnosis not present

## 2023-07-13 DIAGNOSIS — Z800 Family history of malignant neoplasm of digestive organs: Secondary | ICD-10-CM | POA: Diagnosis not present

## 2023-07-13 DIAGNOSIS — Z807 Family history of other malignant neoplasms of lymphoid, hematopoietic and related tissues: Secondary | ICD-10-CM | POA: Insufficient documentation

## 2023-07-13 DIAGNOSIS — Z8261 Family history of arthritis: Secondary | ICD-10-CM | POA: Diagnosis not present

## 2023-07-13 DIAGNOSIS — M2550 Pain in unspecified joint: Secondary | ICD-10-CM | POA: Insufficient documentation

## 2023-07-13 DIAGNOSIS — Z79899 Other long term (current) drug therapy: Secondary | ICD-10-CM | POA: Diagnosis not present

## 2023-07-13 DIAGNOSIS — D472 Monoclonal gammopathy: Secondary | ICD-10-CM | POA: Diagnosis present

## 2023-07-13 DIAGNOSIS — R7989 Other specified abnormal findings of blood chemistry: Secondary | ICD-10-CM | POA: Diagnosis not present

## 2023-07-13 DIAGNOSIS — R634 Abnormal weight loss: Secondary | ICD-10-CM | POA: Insufficient documentation

## 2023-07-13 DIAGNOSIS — Z809 Family history of malignant neoplasm, unspecified: Secondary | ICD-10-CM | POA: Insufficient documentation

## 2023-07-13 DIAGNOSIS — R5383 Other fatigue: Secondary | ICD-10-CM | POA: Insufficient documentation

## 2023-07-13 DIAGNOSIS — Z841 Family history of disorders of kidney and ureter: Secondary | ICD-10-CM | POA: Diagnosis not present

## 2023-07-13 DIAGNOSIS — Z825 Family history of asthma and other chronic lower respiratory diseases: Secondary | ICD-10-CM | POA: Insufficient documentation

## 2023-07-13 DIAGNOSIS — Z7902 Long term (current) use of antithrombotics/antiplatelets: Secondary | ICD-10-CM | POA: Insufficient documentation

## 2023-07-13 DIAGNOSIS — M549 Dorsalgia, unspecified: Secondary | ICD-10-CM | POA: Diagnosis not present

## 2023-07-13 DIAGNOSIS — R768 Other specified abnormal immunological findings in serum: Secondary | ICD-10-CM

## 2023-07-13 DIAGNOSIS — F1290 Cannabis use, unspecified, uncomplicated: Secondary | ICD-10-CM | POA: Insufficient documentation

## 2023-07-13 DIAGNOSIS — Z833 Family history of diabetes mellitus: Secondary | ICD-10-CM | POA: Insufficient documentation

## 2023-07-13 DIAGNOSIS — Z8379 Family history of other diseases of the digestive system: Secondary | ICD-10-CM | POA: Diagnosis not present

## 2023-07-13 DIAGNOSIS — I3139 Other pericardial effusion (noninflammatory): Secondary | ICD-10-CM | POA: Insufficient documentation

## 2023-07-13 DIAGNOSIS — Z8249 Family history of ischemic heart disease and other diseases of the circulatory system: Secondary | ICD-10-CM | POA: Diagnosis not present

## 2023-07-13 DIAGNOSIS — G629 Polyneuropathy, unspecified: Secondary | ICD-10-CM | POA: Diagnosis not present

## 2023-07-13 LAB — CMP (CANCER CENTER ONLY)
ALT: 82 U/L — ABNORMAL HIGH (ref 0–44)
AST: 71 U/L — ABNORMAL HIGH (ref 15–41)
Albumin: 4.3 g/dL (ref 3.5–5.0)
Alkaline Phosphatase: 155 U/L — ABNORMAL HIGH (ref 38–126)
Anion gap: 10 (ref 5–15)
BUN: 17 mg/dL (ref 8–23)
CO2: 24 mmol/L (ref 22–32)
Calcium: 9.5 mg/dL (ref 8.9–10.3)
Chloride: 99 mmol/L (ref 98–111)
Creatinine: 0.74 mg/dL (ref 0.44–1.00)
GFR, Estimated: >60 mL/min (ref >60)
Glucose, Bld: 117 mg/dL — ABNORMAL HIGH (ref 70–99)
Potassium: 4.2 mmol/L (ref 3.5–5.1)
Sodium: 133 mmol/L — ABNORMAL LOW (ref 135–145)
Total Bilirubin: 0.8 mg/dL (ref 0.0–1.2)
Total Protein: 8.5 g/dL — ABNORMAL HIGH (ref 6.5–8.1)

## 2023-07-13 LAB — CBC WITH DIFFERENTIAL (CANCER CENTER ONLY)
Abs Immature Granulocytes: 0.04 K/uL (ref 0.00–0.07)
Basophils Absolute: 0 K/uL (ref 0.0–0.1)
Basophils Relative: 0 %
Eosinophils Absolute: 0.2 K/uL (ref 0.0–0.5)
Eosinophils Relative: 3 %
HCT: 43.9 % (ref 36.0–46.0)
Hemoglobin: 14.7 g/dL (ref 12.0–15.0)
Immature Granulocytes: 1 %
Lymphocytes Relative: 18 %
Lymphs Abs: 1.3 K/uL (ref 0.7–4.0)
MCH: 28.7 pg (ref 26.0–34.0)
MCHC: 33.5 g/dL (ref 30.0–36.0)
MCV: 85.7 fL (ref 80.0–100.0)
Monocytes Absolute: 1 K/uL (ref 0.1–1.0)
Monocytes Relative: 13 %
Neutro Abs: 4.7 K/uL (ref 1.7–7.7)
Neutrophils Relative %: 65 %
Platelet Count: 223 K/uL (ref 150–400)
RBC: 5.12 MIL/uL — ABNORMAL HIGH (ref 3.87–5.11)
RDW: 14 % (ref 11.5–15.5)
WBC Count: 7.3 K/uL (ref 4.0–10.5)
nRBC: 0 % (ref 0.0–0.2)

## 2023-07-13 IMAGING — US US EXTREM LOW VENOUS
1 series · 13 of 24 positions shown · non-contrast
Comparison: LEFT lower extremity venous duplex, 02/20/2017

CLINICAL DATA: Lower extremity swelling x1 month.



[Series 1: us venous img lower bilat (dvt) · portal-venous · 13 of 75 slices shown]
[im 1/75]
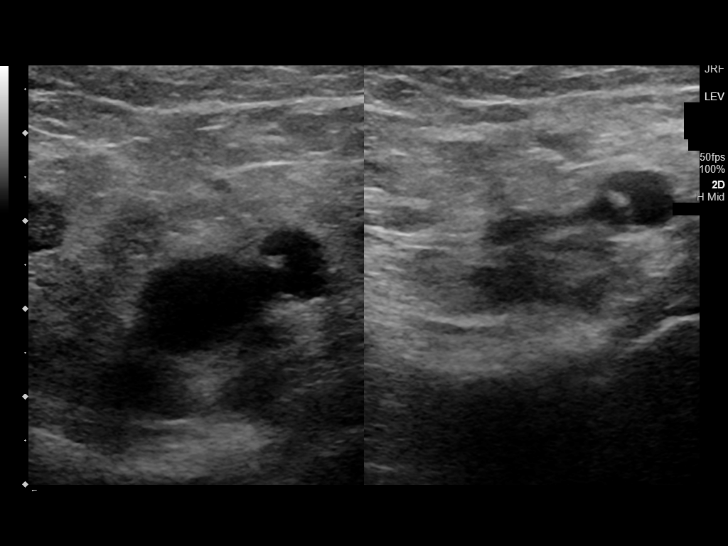
[im 7/75]
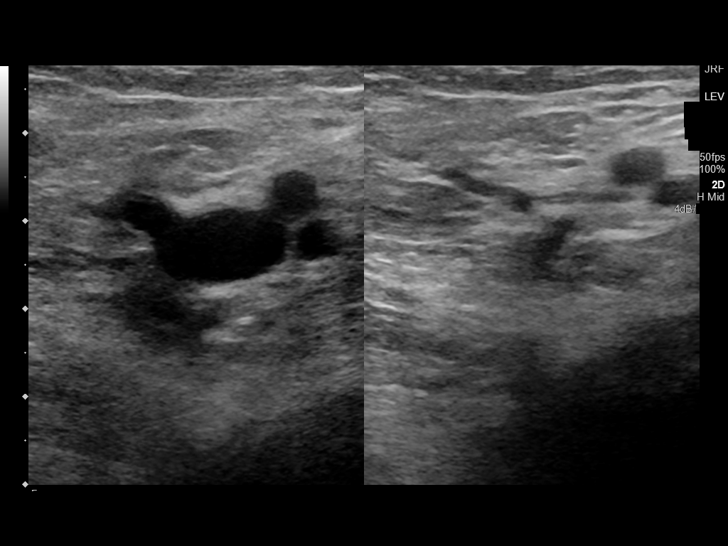
[im 13/75]
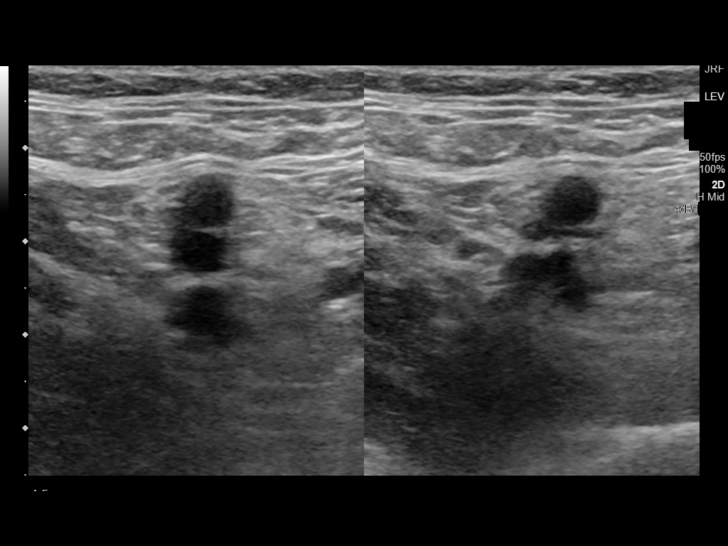
[im 20/75]
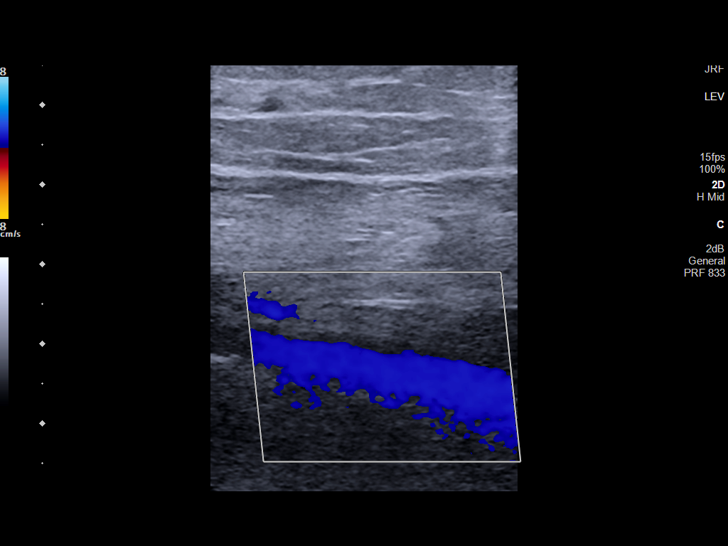
[im 26/75]
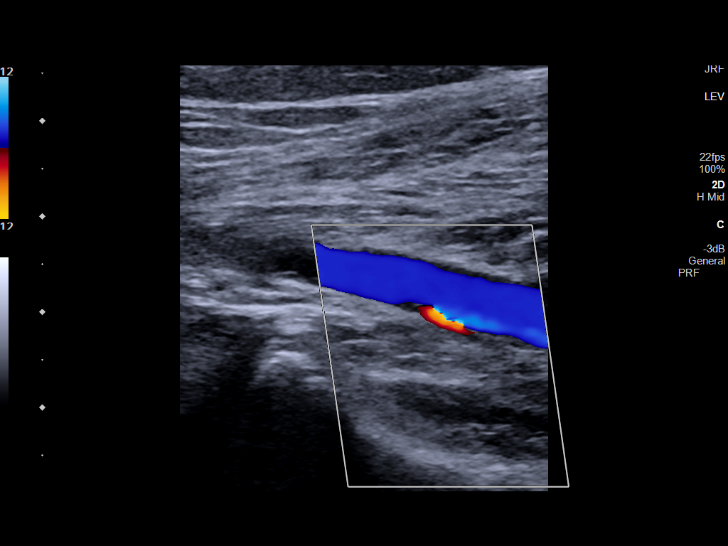
[im 33/75]
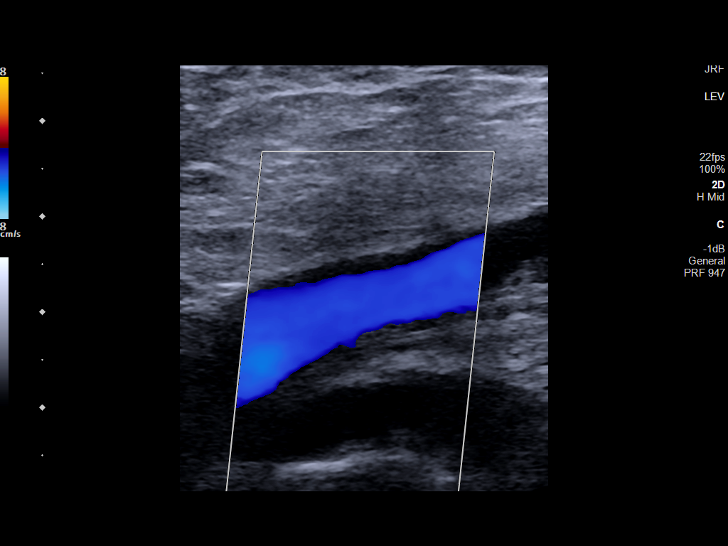
[im 39/75]
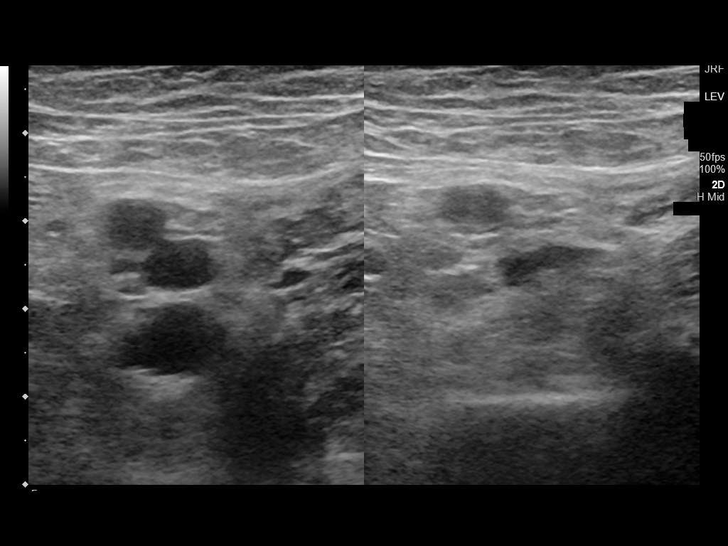
[im 42/75]
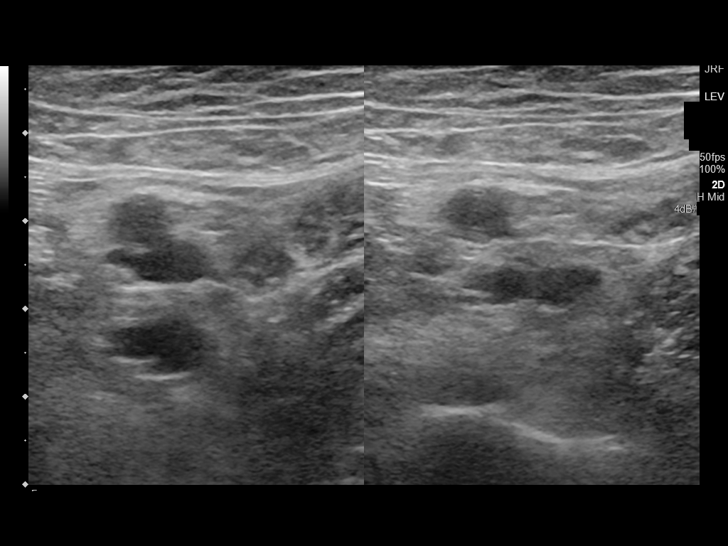
[im 49/75]
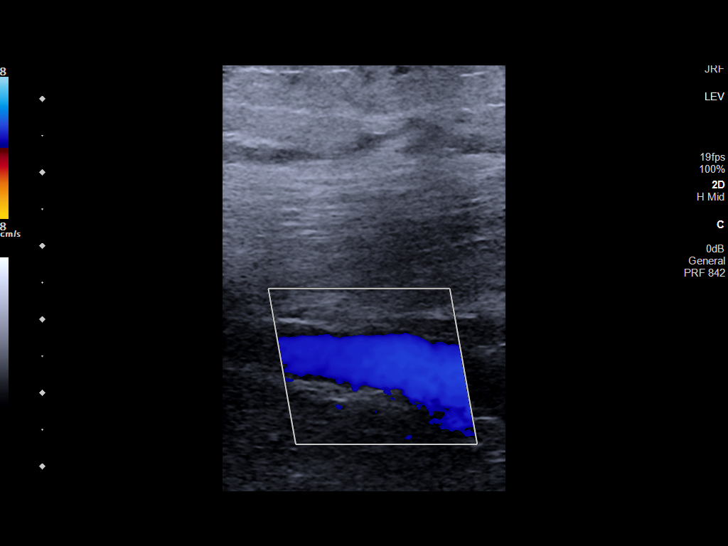
[im 55/75]
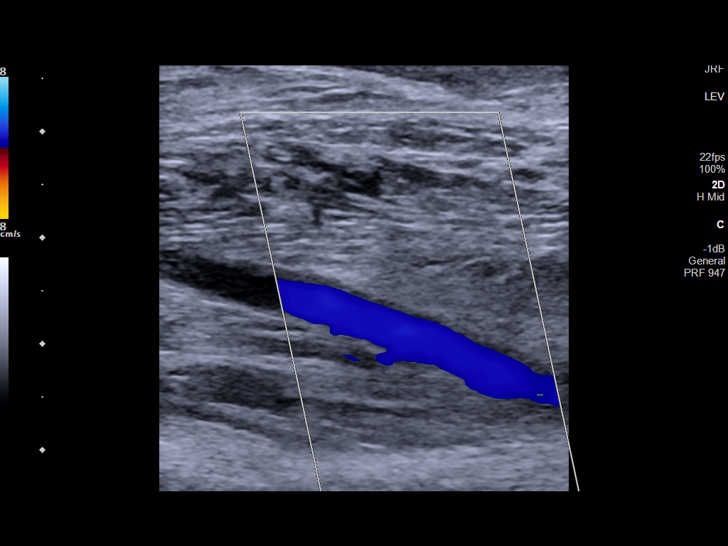
[im 62/75]
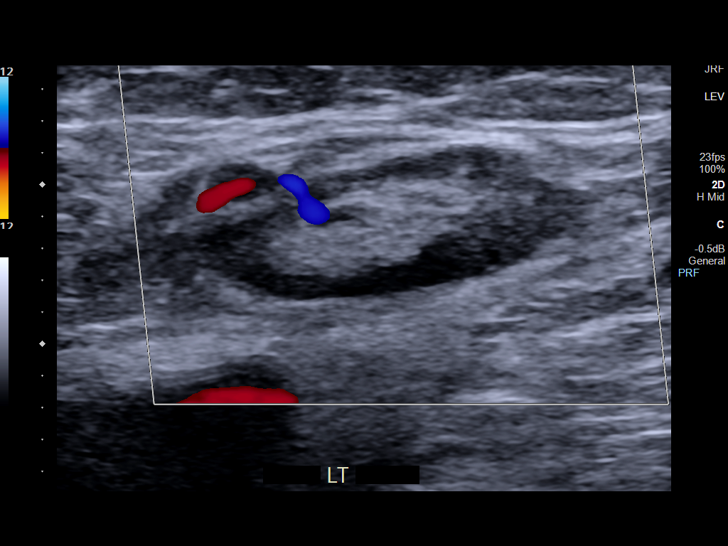
[im 68/75]
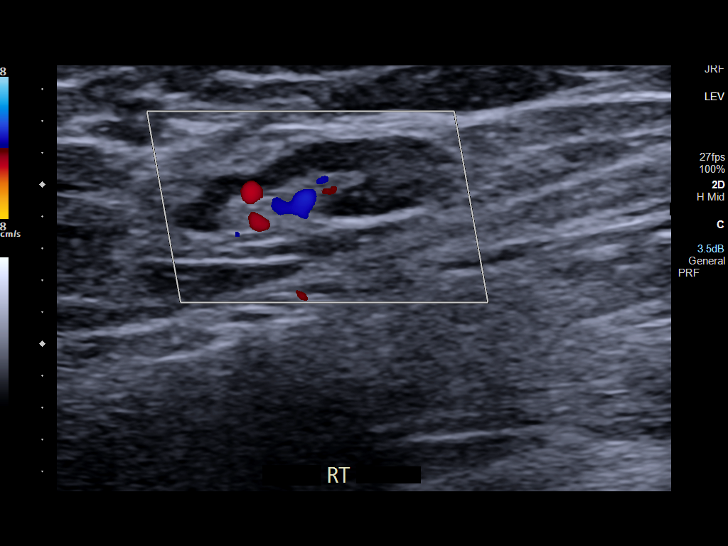
[im 75/75]
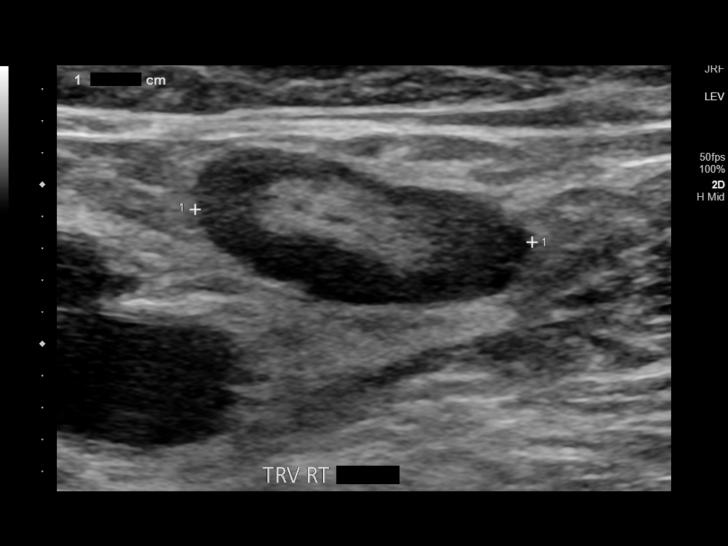

[13 of 24 positions shown; findings below may reference images not displayed]

FINDINGS: RIGHT LOWER EXTREMITY

VENOUS

Normal compressibility of the common femoral, superficial femoral,
and popliteal veins, as well as the visualized calf veins.
Visualized portions of profunda femoral vein and great saphenous
vein unremarkable. No filling defects to suggest DVT on grayscale or
color Doppler imaging. Doppler waveforms show normal direction of
venous flow, normal respiratory plasticity and response to
augmentation.

Limited views of the contralateral common femoral vein are
unremarkable.

OTHER

No evidence of superficial thrombophlebitis or abnormal fluid
collection.

Dominant RIGHT inguinal lymph node, measuring up to 1.0 cm in
greatest short axis diameter.

Limitations: none

LEFT LOWER EXTREMITY

VENOUS

Normal compressibility of the common femoral, superficial femoral,
and popliteal veins, as well as the visualized calf veins.
Visualized portions of profunda femoral vein and great saphenous
vein unremarkable. No filling defects to suggest DVT on grayscale or
color Doppler imaging. Doppler waveforms show normal direction of
venous flow, normal respiratory plasticity and response to
augmentation.

Limited views of the contralateral common femoral vein are
unremarkable.

OTHER

No evidence of superficial thrombophlebitis or abnormal fluid
collection.

Enlarged LEFT inguinal lymph node, measuring up to 0.8 cm in
greatest short axis diameter

Limitations: none
IMPRESSION: 1. No evidence of femoropopliteal DVT within either lower extremity.
2. Dominant bilateral inguinal lymph nodes.

## 2023-07-13 NOTE — Progress Notes (Signed)
 [facility] CONSULT NOTE  Patient Care Team: Robinette Grady FALCON, MD as PCP - General (Geriatric Medicine) Darron Deatrice LABOR, MD as PCP - Cardiology (Cardiology) Rennie Cindy SAUNDERS, MD as Consulting Physician (Oncology)  CHIEF COMPLAINTS/PURPOSE OF CONSULTATION: Monoclonal gammopathy  HEMATOLOGY HISTORY  # MARCH 2023- IFE Serum [Dr.Potter]There is a region of constricted electrophoretic mobility in lambda light chain, which is of unclear significance.   #Severe peripheral neuropathy [s/p flu-nov 2022]  HISTORY OF PRESENTING ILLNESS: Ambulating independently.  Accompanied by her sister.  Peggy Pacheco 63 y.o. female  who returns to clinic for follow up for MGUS. She haschronic aches and pains. Is awaiting GI work up of abnormal LFTs. Patient continues to have tingling and numbness in extremities.     Review of Systems  Constitutional:  Positive for malaise/fatigue and weight loss. Negative for chills, diaphoresis and fever.  HENT:  Negative for nosebleeds and sore throat.   Eyes:  Negative for double vision.  Respiratory:  Negative for cough, hemoptysis, sputum production, shortness of breath and wheezing.   Cardiovascular:  Negative for chest pain, palpitations, orthopnea and leg swelling.  Gastrointestinal:  Negative for abdominal pain, blood in stool, constipation, diarrhea, heartburn, melena, nausea and vomiting.  Genitourinary:  Negative for dysuria, frequency and urgency.  Musculoskeletal:  Positive for back pain and joint pain.  Skin: Negative.  Negative for itching and rash.  Neurological:  Positive for tingling, tremors, sensory change and weakness. Negative for dizziness, focal weakness and headaches.  Endo/Heme/Allergies:  Does not bruise/bleed easily.  Psychiatric/Behavioral:  Negative for depression. The patient is not nervous/anxious and does not have insomnia.     MEDICAL HISTORY:  Past Medical History:  Diagnosis Date   Alcohol use    a. Quit  09/2020.   Alcoholic polyneuropathy (HCC)    Asthma    Diastolic dysfunction    GERD (gastroesophageal reflux disease)    Monoclonal gammopathy of unknown significance    Pericardial effusion    a.09/2020 Echo: EF 60-65%, no rwma, GrI DD, Nl RV fxn. Small-mod circumferential pericardial effusion. Triv TR. Mild-mod AoV sclerosis; b. 10/2020 Echo: EF 60-65%, no rwma. Mod circumferential pericardial effusion.   Tobacco abuse    a. Cutting back - 1/3-1/2 ppd.    SURGICAL HISTORY: Past Surgical History:  Procedure Laterality Date   CATARACT EXTRACTION W/PHACO Right 05/25/2022   Procedure: CATARACT EXTRACTION PHACO AND INTRAOCULAR LENS PLACEMENT (IOC) RIGHT HEALON 5 VISION BLUE  39.56  03:11.1;  Surgeon: Enola Feliciano Hugger, MD;  Location: [facility];  Service: Ophthalmology;  Laterality: Right;   CATARACT EXTRACTION W/PHACO Left 06/22/2022   Procedure: CATARACT EXTRACTION PHACO AND INTRAOCULAR LENS PLACEMENT (IOC) LEFT MALYUGIN;  Surgeon: Enola Feliciano Hugger, MD;  Location: [facility];  Service: Ophthalmology;  Laterality: Left;  15.75 1:28.3   NO PAST SURGERIES      SOCIAL HISTORY: Social History   Socioeconomic History   Marital status: Widowed    Spouse name: Not on file   Number of children: Not on file   Years of education: Not on file   Highest education level: Not on file  Occupational History   Not on file  Tobacco Use   Smoking status: Every Day    Current packs/day: 0.50    Average packs/day: 0.5 packs/day for 45.0 years (22.5 ttl pk-yrs)    Types: Cigarettes   Smokeless tobacco: Never  Vaping Use   Vaping status: Never Used  Substance and Sexual Activity   Alcohol  use: Yes    Comment: occassionally   Drug use: Yes    Types: Marijuana   Sexual activity: Not Currently  Other Topics Concern   Not on file  Social History Narrative   Patient lives in [place].  Active smoker.  Used to work in a Tax inspector; unable to [employer] December  2022.    Social Drivers of Corporate investment banker Strain: Low Risk  (07/10/2023)   Received from [facility]   Overall Financial Resource Strain (CARDIA)    How hard is it for you to pay for the very basics like food, housing, medical care, and heating?: Not very hard  Food Insecurity: No Food Insecurity (07/10/2023)   Received from [facility]   Hunger Vital Sign    Within the past 12 months, you worried that your food would run out before you got the money to buy more.: Never true    Within the past 12 months, the food you bought just didn't last and you didn't have money to get more.: Never true  Transportation Needs: No Transportation Needs (07/10/2023)   Received from [facility] - Transportation    Lack of Transportation (Medical): No    Lack of Transportation (Non-Medical): No  Physical Activity: Not on file  Stress: Not on file  Social Connections: Not on file  Intimate Partner Violence: Not At Risk (06/20/2023)   Received from [facility]   Humiliation, Afraid, Rape, and Kick questionnaire    Within the last year, have you been afraid of your partner or ex-partner?: No    Within the last year, have you been humiliated or emotionally abused in other ways by your partner or ex-partner?: No    Within the last year, have you been kicked, hit, slapped, or otherwise physically hurt by your partner or ex-partner?: No    Within the last year, have you been raped or forced to have any kind of sexual activity by your partner or ex-partner?: No    FAMILY HISTORY: Family History  Adopted: Yes  Problem Relation Age of Onset   Congestive Heart Failure Mother    Kidney failure Mother    Asthma Mother    Colon cancer Father    Diabetes Father    Multiple myeloma Sister    Melanoma Sister    Congestive Heart Failure Sister    Lupus Sister    Sarcoidosis Sister    Rheum arthritis Sister    Heart attack Brother    Cancer Brother        unknown-finger    Diabetes Brother    Cirrhosis Brother     ALLERGIES:  is allergic to cat dander, dog epithelium (canis lupus familiaris), and gramineae pollens.  MEDICATIONS:  Current Outpatient Medications  Medication Sig Dispense Refill   albuterol  (VENTOLIN  HFA) 108 (90 Base) MCG/ACT inhaler Inhale 1-2 puffs into the lungs every 6 (six) hours as needed for wheezing or shortness of breath. 1 g 0   aspirin  81 MG chewable tablet Chew 81 mg by mouth.     atorvastatin (LIPITOR) 10 MG tablet Take 10 mg by mouth.     celecoxib (CELEBREX) 100 MG capsule Take 200 mg by mouth 2 (two) times daily.     clopidogrel (PLAVIX) 75 MG tablet Take 75 mg by mouth daily.     gabapentin (NEURONTIN) 300 MG capsule Take 600 mg by mouth 5 (five) times daily.     nortriptyline (PAMELOR) 10  MG capsule Take 20 mg by mouth 2 (two) times daily.     traZODone (DESYREL) 100 MG tablet Take by mouth.     Vitamin D, Ergocalciferol, (DRISDOL) 1.25 MG (50000 UNIT) CAPS capsule Take by mouth.     Spacer/Aero-Holding Chambers (AEROCHAMBER MV) inhaler Use as instructed (Patient not taking: Reported on 07/13/2023) 1 each 2   torsemide  40 MG TABS Take 40 mg by mouth daily. (Patient not taking: Reported on 07/13/2023) 30 tablet 0   No current facility-administered medications for this visit.   PHYSICAL EXAMINATION: Vitals:   07/13/23 1421  BP: (!) 158/89  Pulse: 80  Resp: 19  Temp: 97.8 F (36.6 C)  SpO2: 97%   Filed Weights   07/13/23 1421  Weight: 156 lb 8 oz (71 kg)   Physical Exam Vitals and nursing note reviewed.  HENT:     Head: Normocephalic and atraumatic.     Mouth/Throat:     Pharynx: Oropharynx is clear.  Eyes:     Extraocular Movements: Extraocular movements intact.     Pupils: Pupils are equal, round, and reactive to light.  Cardiovascular:     Rate and Rhythm: Normal rate and regular rhythm.  Pulmonary:     Comments: Decreased breath sounds bilaterally.  Abdominal:     Palpations: Abdomen is soft.   Musculoskeletal:        General: Normal range of motion.     Cervical back: Normal range of motion.  Skin:    General: Skin is warm.  Neurological:     General: No focal deficit present.     Mental Status: She is alert and oriented to person, place, and time.  Psychiatric:        Behavior: Behavior normal.        Judgment: Judgment normal.    LABORATORY DATA:  I have reviewed the data as listed Lab Results  Component Value Date   WBC 7.3 07/13/2023   HGB 14.7 07/13/2023   HCT 43.9 07/13/2023   MCV 85.7 07/13/2023   PLT 223 07/13/2023   Recent Labs    07/13/23 1400  NA 133*  K 4.2  CL 99  CO2 24  GLUCOSE 117*  BUN 17  CREATININE 0.74  CALCIUM 9.5  GFRNONAA >60  PROT 8.5*  ALBUMIN 4.3  AST 71*  ALT 82*  ALKPHOS 155*  BILITOT 0.8   No results found.  Lab Results  Component Value Date   KPAFRELGTCHN 34.2 (H) 02/08/2022   KPAFRELGTCHN 36.8 (H) 06/28/2021   LAMBDASER 23.1 02/08/2022   LAMBDASER 21.1 06/28/2021   KAPLAMBRATIO 1.48 02/08/2022   KAPLAMBRATIO 1.74 (H) 06/28/2021   Assessment & Plan:   Elevated kappa free light chain-   # March 2023-lambda light chain on serum immunofixation noted  Ordered by neurology in the context of her neuropathy. She does not have anemia, renal dysfunction, or hypercalcemia. FEB 2024- NEG Immuno-fixation; k/L= ratio= normal.  Would not recommend a bone marrow biopsy.  I do not think patient has MGUS or any form of myeloma.  Patient did not have skeletal survey done.   # Today- hmg is normal. Calcium normal. Renal function normal. SPEP and KLLC pending. If normal, recommend releasing from follow up.    # Peripheral neuropathy-on gabapentin; work-up as above. Continue follow-up with neurology.   # Active smoker- ~1ppd- CT NOV 2022- [ER/flu]- pericardial effusion otherwise no malignancy noted. Recommend quitting; patient not interested. Recommend enrollment in LDCT screening program.    # ? Inflammatory  arthritis- elevated  CRP; s/p  evaluation with rheumatology; no diagnosis made.    # DISPOSITION: 1 year- labs, week later see me for follow up- la  No problem-specific Assessment & Plan notes found for this encounter.  All questions were answered. The patient knows to call the clinic with any problems, questions or concerns.    Tinnie KANDICE Dawn, NP 07/13/2023

## 2023-07-16 LAB — KAPPA/LAMBDA LIGHT CHAINS
Kappa free light chain: 28 mg/L — ABNORMAL HIGH (ref 3.3–19.4)
Kappa, lambda light chain ratio: 1.18 (ref 0.26–1.65)
Lambda free light chains: 23.8 mg/L (ref 5.7–26.3)

## 2023-07-17 LAB — MULTIPLE MYELOMA PANEL, SERUM
Albumin SerPl Elph-Mcnc: 4.1 g/dL (ref 2.9–4.4)
Albumin/Glob SerPl: 1.1 (ref 0.7–1.7)
Alpha 1: 0.3 g/dL (ref 0.0–0.4)
Alpha2 Glob SerPl Elph-Mcnc: 0.9 g/dL (ref 0.4–1.0)
B-Globulin SerPl Elph-Mcnc: 1.2 g/dL (ref 0.7–1.3)
Gamma Glob SerPl Elph-Mcnc: 1.6 g/dL (ref 0.4–1.8)
Globulin, Total: 3.9 g/dL (ref 2.2–3.9)
IgA: 326 mg/dL (ref 87–352)
IgG (Immunoglobin G), Serum: 1497 mg/dL (ref 586–1602)
IgM (Immunoglobulin M), Srm: 220 mg/dL — ABNORMAL HIGH (ref 26–217)
Total Protein ELP: 8 g/dL (ref 6.0–8.5)

## 2023-07-30 ENCOUNTER — Ambulatory Visit
Admission: EM | Admit: 2023-07-30 | Discharge: 2023-07-30 | Attending: Emergency Medicine | Admitting: Emergency Medicine

## 2023-07-30 DIAGNOSIS — S0083XA Contusion of other part of head, initial encounter: Secondary | ICD-10-CM

## 2023-07-30 DIAGNOSIS — S0011XA Contusion of right eyelid and periocular area, initial encounter: Secondary | ICD-10-CM | POA: Diagnosis not present

## 2023-07-30 DIAGNOSIS — S0033XA Contusion of nose, initial encounter: Secondary | ICD-10-CM

## 2023-07-30 NOTE — ED Triage Notes (Addendum)
 Patient states that she was walking toward a fence and tripped over something hitting her face on an iron skillet last night. Patient sates that she injured her nose. Patient has a bruised nose and swollen eyes. Patient is on blood thinners. Patient states that she hasn't taken her blood thinners in 2 days. Patient states that she had a stent out in her neck a few weeks ago. Patient states that she al;so hit both elbows.

## 2023-07-30 NOTE — ED Provider Notes (Signed)
 HPI  SUBJECTIVE:  Peggy Pacheco is a 63 y.o. female who presents with pain, swelling, bruising along the right eye, nose, left eye and cheek after having a trip and fall over a lattice last night, landing onto a cast iron skillet in the yard.  States that she hit her nose and right side of her face with subsequent epistaxis.  No eye pain, blurry vision, double vision, visual loss, limitation of eye movement, headache, loss of consciousness, dental injury.  No nasal congestion until coming here.  She has not tried anything for her symptoms.  No aggravating or alleviating factors. Patient has a past medical history of asthma, congestive heart failure, GERD, left carotid artery stenosis with stent placed on 7/7, pericardial effusion, hypertension, MGUS, chronic neuropathy, TIA, tobacco use, elevated LFTs.  She was started on aspirin  and Plavix on 6/16 for carotid artery stenosis.  States that she has not taken the aspirin  and Plavix for 2 days.  PCP: [facility].  She denies syncope, preceding dizziness, chest pain, palpitations, shortness of breath prior to or causing the fall.  Past Medical History:  Diagnosis Date   Alcohol use    a. Quit 09/2020.   Alcoholic polyneuropathy (HCC)    Asthma    Diastolic dysfunction    GERD (gastroesophageal reflux disease)    Monoclonal gammopathy of unknown significance    Pericardial effusion    a.09/2020 Echo: EF 60-65%, no rwma, GrI DD, Nl RV fxn. Small-mod circumferential pericardial effusion. Triv TR. Mild-mod AoV sclerosis; b. 10/2020 Echo: EF 60-65%, no rwma. Mod circumferential pericardial effusion.   Tobacco abuse    a. Cutting back - 1/3-1/2 ppd.    Past Surgical History:  Procedure Laterality Date   CATARACT EXTRACTION W/PHACO Right 05/25/2022   Procedure: CATARACT EXTRACTION PHACO AND INTRAOCULAR LENS PLACEMENT (IOC) RIGHT HEALON 5 VISION BLUE  39.56  03:11.1;  Surgeon: Enola Feliciano Hugger, MD;  Location: [facility];  Service:  Ophthalmology;  Laterality: Right;   CATARACT EXTRACTION W/PHACO Left 06/22/2022   Procedure: CATARACT EXTRACTION PHACO AND INTRAOCULAR LENS PLACEMENT (IOC) LEFT MALYUGIN;  Surgeon: Enola Feliciano Hugger, MD;  Location: [facility];  Service: Ophthalmology;  Laterality: Left;  15.75 1:28.3   NO PAST SURGERIES      Family History  Adopted: Yes  Problem Relation Age of Onset   Congestive Heart Failure Mother    Kidney failure Mother    Asthma Mother    Colon cancer Father    Diabetes Father    Multiple myeloma Sister    Melanoma Sister    Congestive Heart Failure Sister    Lupus Sister    Sarcoidosis Sister    Rheum arthritis Sister    Heart attack Brother    Cancer Brother        unknown-finger   Diabetes Brother    Cirrhosis Brother     Social History   Tobacco Use   Smoking status: Every Day    Current packs/day: 0.50    Average packs/day: 0.5 packs/day for 45.0 years (22.5 ttl pk-yrs)    Types: Cigarettes   Smokeless tobacco: Never  Vaping Use   Vaping status: Never Used  Substance Use Topics   Alcohol use: Yes    Comment: occassionally   Drug use: Yes    Types: Marijuana    No current facility-administered medications for this encounter.  Current Outpatient Medications:    aspirin  81 MG chewable tablet, Chew 81 mg by mouth., Disp: , Rfl:  atorvastatin (LIPITOR) 10 MG tablet, Take 10 mg by mouth., Disp: , Rfl:    clopidogrel (PLAVIX) 75 MG tablet, Take 75 mg by mouth daily., Disp: , Rfl:    albuterol  (VENTOLIN  HFA) 108 (90 Base) MCG/ACT inhaler, Inhale 1-2 puffs into the lungs every 6 (six) hours as needed for wheezing or shortness of breath., Disp: 1 g, Rfl: 0   celecoxib (CELEBREX) 100 MG capsule, Take 200 mg by mouth 2 (two) times daily., Disp: , Rfl:    gabapentin (NEURONTIN) 300 MG capsule, Take 600 mg by mouth 5 (five) times daily., Disp: , Rfl:    nortriptyline (PAMELOR) 10 MG capsule, Take 20 mg by mouth 2 (two) times daily., Disp: , Rfl:     Spacer/Aero-Holding Chambers (AEROCHAMBER MV) inhaler, Use as instructed (Patient not taking: Reported on 07/13/2023), Disp: 1 each, Rfl: 2   torsemide  40 MG TABS, Take 40 mg by mouth daily. (Patient not taking: Reported on 07/13/2023), Disp: 30 tablet, Rfl: 0   traZODone (DESYREL) 100 MG tablet, Take by mouth., Disp: , Rfl:    Vitamin D, Ergocalciferol, (DRISDOL) 1.25 MG (50000 UNIT) CAPS capsule, Take by mouth., Disp: , Rfl:   Allergies  Allergen Reactions   Cat Dander    Dog Epithelium (Canis Lupus Familiaris)    Gramineae Pollens      ROS  As noted in HPI.   Physical Exam  BP (!) 149/86 (BP Location: Left Arm)   Pulse 75   Temp 98.5 F (36.9 C) (Oral)   Resp 17   SpO2 97%   Constitutional: Well developed, well nourished, no acute distress Eyes: PERRL, EOMI, conjunctiva normal bilaterally.  No direct or consensual photophobia. Periorbital swelling, bruising right side.  Point tenderness along the right lower orbital rim.  Tender bruising along the cheek.    Bruising along medial left eye   Nasal bridge swelling, bruising        HENT: Normocephalic, atraumatic,mucus membranes moist.  No dental trauma.  No hemotympanums.  Positive right-sided nasal congestion with erythematous, swollen turbinates on the right.  Positive nasal bridge contusion with tenderness.  No septal hematoma.  No obvious deviated septum. Respiratory: Clear to auscultation bilaterally, no rales, no wheezing, no rhonchi Cardiovascular: Normal rate and rhythm, no murmurs, no gallops, no rubs GI: Soft, nondistended, normal bowel sounds, nontender, no rebound, no guarding Back: no CVAT skin: No rash, skin intact Musculoskeletal: No edema, no tenderness, no deformities Neurologic: Alert & oriented x 3, CN III-XII grossly intact, no motor deficits, sensation grossly intact Psychiatric: Speech and behavior appropriate   ED Course   Medications - No data to display  No orders of the defined  types were placed in this encounter.  No results found for this or any previous visit (from the past 24 hours). No results found.  ED Clinical Impression  1. Periorbital ecchymosis of right eye, initial encounter   2. Traumatic ecchymosis of nose, initial encounter   3. Contusion of face, initial encounter      ED Assessment/Plan     Outside records reviewed.  Additional medical history obtained.  As noted in HPI.  I am concerned for a right sided orbital rim or orbital floor fracture.  I also suspect that she has broken her nose.  She also has a facial contusion, but I doubt that she has another facial fracture.  There does not appear to be any damage to the eye.  Talked to radiology, who advised that given the extent of the  injuries, advanced imaging would be optimal to rule out orbital rim or orbital floor fracture.  Transferring to the emergency department for further evaluation and imaging.  She is stable to go by private vehicle.  Discussed rationale for transfer to the emergency department with patient and family member.  They agree to go.  No orders of the defined types were placed in this encounter.     *This clinic note was created using Dragon dictation software. Therefore, there may be occasional mistakes despite careful proofreading. ?    Van Knee, MD 07/30/23 [phone]

## 2023-07-30 NOTE — ED Notes (Signed)
 Patient is being discharged from the Urgent Care and sent to the Emergency Department via personal vehicle . Per Dr. Van, patient is in need of higher level of care due to orbital rim or orbital floor fracture. Patient is aware and verbalizes understanding of plan of care.  Vitals:   07/30/23 1456  BP: (!) 149/86  Pulse: 75  Resp: 17  Temp: 98.5 F (36.9 C)  SpO2: 97%

## 2023-07-30 NOTE — Discharge Instructions (Signed)
 I am concerned that you could have an orbital rim or orbital floor fracture given the extent of your injuries.  I also believe that you may have broken your nose.  We do not have advanced imaging here at the urgent care today.  Please go to the nearest ER for further evaluation

## 2024-01-31 ENCOUNTER — Ambulatory Visit: Admission: EM | Admit: 2024-01-31 | Discharge: 2024-01-31 | Disposition: A | Discharge status: Home / Self Care

## 2024-01-31 DIAGNOSIS — J069 Acute upper respiratory infection, unspecified: Secondary | ICD-10-CM | POA: Diagnosis not present

## 2024-01-31 DIAGNOSIS — K047 Periapical abscess without sinus: Secondary | ICD-10-CM | POA: Diagnosis not present

## 2024-01-31 MED ORDER — AMOXICILLIN-POT CLAVULANATE 875-125 MG PO TABS: 1.0000 | ORAL_TABLET | Freq: Two times a day (BID) | ORAL | 0 refills | Status: AC
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
## 2024-01-31 NOTE — Discharge Instructions (Addendum)
 The Augmentin  twice daily with food for 7 days for treatment of your eye and dental infection.  Perform sinus irrigation 2-3 times a day with a NeilMed sinus rinse kit and distilled water.  Do not use tap water.  You can use plain over-the-counter Mucinex every 6 hours to break up the stickiness of the mucus so your body can clear it.  Increase your oral fluid intake to thin out your mucus so that is also able for your body to clear more easily.  Take an over-the-counter probiotic, such as Culturelle-align-activia, 1 hour after each dose of antibiotic to prevent diarrhea.  Take the Augmentin  twice daily with food for 7 days for treatment of your dental infection.  Use over-the-counter Tylenol  and ibuprofen for swelling and mild to moderate pain.  Rinse with warm salt water, or Listerine, after each meal to remove food particles and wash away any pus that is collecting.  If you develop any increasing or swelling, fever, pain, or difficulty swallowing you to go to the emergency department at [facility] with a have an oral surgeon and also a dentist on-call.
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
## 2024-07-11 ENCOUNTER — Other Ambulatory Visit

## 2024-07-25 ENCOUNTER — Ambulatory Visit: Admitting: Internal Medicine
